# Patient Record
Sex: Female | Born: 1965 | Race: Black or African American | Hispanic: No | Smoking: Former smoker
Health system: Southern US, Community
[De-identification: ages and names within clinical notes are randomized; demographics above are authoritative.]

## PROBLEM LIST (undated history)

## (undated) DIAGNOSIS — J301 Allergic rhinitis due to pollen: Secondary | ICD-10-CM

## (undated) DIAGNOSIS — F419 Anxiety disorder, unspecified: Secondary | ICD-10-CM

## (undated) DIAGNOSIS — Z9889 Other specified postprocedural states: Secondary | ICD-10-CM

## (undated) DIAGNOSIS — D219 Benign neoplasm of connective and other soft tissue, unspecified: Secondary | ICD-10-CM

## (undated) DIAGNOSIS — I10 Essential (primary) hypertension: Secondary | ICD-10-CM

## (undated) DIAGNOSIS — T7840XA Allergy, unspecified, initial encounter: Secondary | ICD-10-CM

## (undated) DIAGNOSIS — R112 Nausea with vomiting, unspecified: Secondary | ICD-10-CM

## (undated) HISTORY — DX: Essential (primary) hypertension: I10

## (undated) HISTORY — PX: WISDOM TOOTH EXTRACTION: SHX21

## (undated) HISTORY — DX: Allergic rhinitis due to pollen: J30.1

## (undated) HISTORY — PX: TUBAL LIGATION: SHX77

## (undated) HISTORY — DX: Allergy, unspecified, initial encounter: T78.40XA

## (undated) HISTORY — DX: Anxiety disorder, unspecified: F41.9

## (undated) HISTORY — PX: OTHER SURGICAL HISTORY: SHX169

---

## 2008-05-17 ENCOUNTER — Encounter: Admission: RE | Admit: 2008-05-17 | Discharge: 2008-05-17 | Payer: Self-pay | Admitting: Obstetrics

## 2009-06-08 ENCOUNTER — Encounter: Admission: RE | Admit: 2009-06-08 | Discharge: 2009-06-08 | Payer: Self-pay | Admitting: Obstetrics

## 2010-06-12 ENCOUNTER — Encounter: Admission: RE | Admit: 2010-06-12 | Discharge: 2010-06-12 | Payer: Self-pay | Admitting: Obstetrics

## 2011-05-17 ENCOUNTER — Other Ambulatory Visit: Payer: Self-pay | Admitting: Obstetrics

## 2011-05-17 DIAGNOSIS — Z1231 Encounter for screening mammogram for malignant neoplasm of breast: Secondary | ICD-10-CM

## 2011-06-25 ENCOUNTER — Ambulatory Visit
Admission: RE | Admit: 2011-06-25 | Discharge: 2011-06-25 | Disposition: A | Discharge status: Home / Self Care | Payer: BC Managed Care – PPO | Source: Ambulatory Visit | Attending: Obstetrics | Admitting: Obstetrics

## 2011-06-25 DIAGNOSIS — Z1231 Encounter for screening mammogram for malignant neoplasm of breast: Secondary | ICD-10-CM

## 2012-06-09 ENCOUNTER — Other Ambulatory Visit: Payer: Self-pay | Admitting: Obstetrics

## 2012-06-09 DIAGNOSIS — Z1231 Encounter for screening mammogram for malignant neoplasm of breast: Secondary | ICD-10-CM

## 2012-07-02 ENCOUNTER — Ambulatory Visit
Admission: RE | Admit: 2012-07-02 | Discharge: 2012-07-02 | Disposition: A | Discharge status: Home / Self Care | Payer: BC Managed Care – PPO | Source: Ambulatory Visit | Attending: Obstetrics | Admitting: Obstetrics

## 2012-07-02 DIAGNOSIS — Z1231 Encounter for screening mammogram for malignant neoplasm of breast: Secondary | ICD-10-CM

## 2013-02-19 ENCOUNTER — Emergency Department (HOSPITAL_BASED_OUTPATIENT_CLINIC_OR_DEPARTMENT_OTHER)
Admission: EM | Admit: 2013-02-19 | Discharge: 2013-02-19 | Disposition: A | Discharge status: Home / Self Care | Payer: BC Managed Care – PPO | Attending: Emergency Medicine | Admitting: Emergency Medicine

## 2013-02-19 ENCOUNTER — Encounter (HOSPITAL_BASED_OUTPATIENT_CLINIC_OR_DEPARTMENT_OTHER): Payer: Self-pay

## 2013-02-19 DIAGNOSIS — Y9389 Activity, other specified: Secondary | ICD-10-CM | POA: Insufficient documentation

## 2013-02-19 DIAGNOSIS — Y92410 Unspecified street and highway as the place of occurrence of the external cause: Secondary | ICD-10-CM | POA: Insufficient documentation

## 2013-02-19 DIAGNOSIS — S239XXA Sprain of unspecified parts of thorax, initial encounter: Secondary | ICD-10-CM | POA: Insufficient documentation

## 2013-02-19 MED ORDER — IBUPROFEN 800 MG PO TABS: 800.0000 mg | ORAL_TABLET | Freq: Three times a day (TID) | ORAL | Status: DC

## 2013-02-19 MED ORDER — METHOCARBAMOL 500 MG PO TABS: 500.0000 mg | ORAL_TABLET | Freq: Two times a day (BID) | ORAL | Status: DC
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
## 2013-02-19 NOTE — ED Provider Notes (Signed)
Medical screening examination/treatment/procedure(s) were performed by non-physician practitioner and as supervising physician I was immediately available for consultation/collaboration.   Gilda Crease, MD 02/19/13 [phone]

## 2013-05-26 ENCOUNTER — Other Ambulatory Visit: Payer: Self-pay

## 2013-05-26 DIAGNOSIS — Z1231 Encounter for screening mammogram for malignant neoplasm of breast: Secondary | ICD-10-CM

## 2013-07-06 ENCOUNTER — Ambulatory Visit: Payer: BC Managed Care – PPO

## 2013-07-10 ENCOUNTER — Ambulatory Visit: Payer: BC Managed Care – PPO

## 2013-07-15 ENCOUNTER — Ambulatory Visit
Admission: RE | Admit: 2013-07-15 | Discharge: 2013-07-15 | Disposition: A | Discharge status: Home / Self Care | Payer: BC Managed Care – PPO | Source: Ambulatory Visit

## 2013-07-15 DIAGNOSIS — Z1231 Encounter for screening mammogram for malignant neoplasm of breast: Secondary | ICD-10-CM

## 2014-06-22 ENCOUNTER — Other Ambulatory Visit: Payer: Self-pay

## 2014-06-22 DIAGNOSIS — Z1231 Encounter for screening mammogram for malignant neoplasm of breast: Secondary | ICD-10-CM

## 2014-07-19 ENCOUNTER — Ambulatory Visit
Admission: RE | Admit: 2014-07-19 | Discharge: 2014-07-19 | Disposition: A | Discharge status: Home / Self Care | Payer: BC Managed Care – PPO | Source: Ambulatory Visit

## 2014-07-19 DIAGNOSIS — Z1231 Encounter for screening mammogram for malignant neoplasm of breast: Secondary | ICD-10-CM

## 2015-05-30 ENCOUNTER — Other Ambulatory Visit: Payer: Self-pay | Admitting: Obstetrics and Gynecology

## 2015-05-30 DIAGNOSIS — D259 Leiomyoma of uterus, unspecified: Secondary | ICD-10-CM

## 2015-06-07 ENCOUNTER — Other Ambulatory Visit: Payer: Self-pay | Admitting: Obstetrics and Gynecology

## 2015-06-07 ENCOUNTER — Ambulatory Visit
Admission: RE | Admit: 2015-06-07 | Discharge: 2015-06-07 | Disposition: A | Discharge status: Home / Self Care | Payer: 59 | Source: Ambulatory Visit | Attending: Obstetrics and Gynecology | Admitting: Obstetrics and Gynecology

## 2015-06-07 DIAGNOSIS — D259 Leiomyoma of uterus, unspecified: Secondary | ICD-10-CM | POA: Insufficient documentation

## 2015-06-07 HISTORY — DX: Benign neoplasm of connective and other soft tissue, unspecified: D21.9

## 2015-06-07 NOTE — Consult Note (Signed)
Chief Complaint: Chief Complaint  Patient presents with  . Fibroids    Consult for [place]    Referring Physician(s): Cousins,Sheronette  History of Present Illness: [name] is a 49 y.o. (G2, P2) female significant only for GERD and obesity who presents to the IR clinic for evaluation of symptomatic uterine fibroids.  The patient is accompanied by her husband though serves as her own historian.  The patient has known she's had fibroids for "many years."  Previously, the patient had symptoms of menorrhagia, however this improved following an endometrial ablation performed in 2012.  Since that time, the patients states that her symptoms of dysmenorrhea has worsened, currently resulting in her being nearly bed bound for 2 days of her cycle due to intense pelvic pain.  The patient admits to peri-menopausal symptoms with her cycle currently being slightly irregular, occuring every 6-8 weeks.  Her cycle lasts approximately 4 days, 2 days of which are associated with dysmenorrhea.  She denies recurrent menorrhagia though admits to occasional spotting.  She admits to abdominal bloating and distension which is worse at the time of her cycle.  She denies dysuria, hematuria or flank pain.  She denies constipation, diarrhea, bloody or melanotic stools.  The patient would like to avoid a surgical procedure.  Past Medical History  Diagnosis Date  . Fibroids   . Fibroids     Past Surgical History  Procedure Laterality Date  . Tubal ligation    . Nova sure      Allergies: Codeine  Medications: Prior to Admission medications   Medication Sig Start Date End Date Taking? Authorizing Provider  BIOTIN PO Take by mouth.   Yes Historical Provider, MD  Cyanocobalamin (VITAMIN B 12 PO) Take 1 tablet by mouth daily.   Yes Historical Provider, MD  ibuprofen (ADVIL,MOTRIN) 800 MG tablet Take 1 tablet (800 mg total) by mouth 3 (three) times daily. 02/19/13  Yes Hollace Kinnier Sofia, PA-C    Multiple Vitamins-Minerals (MULTIVITAMIN PO) Take by mouth.   Yes Historical Provider, MD  methocarbamol (ROBAXIN) 500 MG tablet Take 1 tablet (500 mg total) by mouth 2 (two) times daily. Patient not taking: Reported on 06/07/2015 02/19/13   Fransico Meadow, PA-C     No family history on file.  History   Social History  . Marital Status: Unknown    Spouse Name: N/A  . Number of Children: N/A  . Years of Education: N/A   Social History Main Topics  . Smoking status: Never Smoker   . Smokeless tobacco: Not on file  . Alcohol Use: No  . Drug Use: No  . Sexual Activity: Not on file   Other Topics Concern  . Not on file   Social History Narrative    ECOG Status: 0 - Asymptomatic  Review of Systems: A 12 point ROS discussed and pertinent positives are indicated in the HPI above.  All other systems are negative.  Review of Systems  Constitutional: Negative.   Respiratory: Negative.   Cardiovascular: Negative.   Gastrointestinal: Positive for abdominal pain.       Patient admits to abdominal bloating, worse at the time of her cycle.  Genitourinary: Positive for menstrual problem and pelvic pain. Negative for urgency, hematuria and difficulty urinating.  Musculoskeletal: Negative for back pain.  Psychiatric/Behavioral: Negative.     Vital Signs: BP 116/70 mmHg  Pulse 88  Temp(Src) 98.5 F (36.9 C) (Oral)  Resp 14  Ht 5\' 4"  (1.626 m)  Wt  182 lb (82.555 kg)  BMI 31.22 kg/m2  SpO2 99%  LMP 05/17/2015 (Approximate)  Physical Exam  Constitutional: She appears well-developed and well-nourished.  HENT:  Head: Normocephalic and atraumatic.  Cardiovascular: Normal rate, regular rhythm and intact distal pulses.   Easily palpable R CFA and DP pulses.  Pulmonary/Chest: Effort normal and breath sounds normal.  Abdominal: Soft. Bowel sounds are normal. She exhibits no mass. There is no tenderness.  I am unable to palpate the uterine fundus.  Skin: Skin is warm and dry.   Psychiatric: She has a normal mood and affect.  Nursing note and vitals reviewed.   Imaging:  Intra-office pelvic US performed 09/29/2014 reports multiple fibroids with 2 discretely measured, the largest of which measures 2.5 cm in diameter. The bilateral ovaries are reportedly normal.   Labs:  CBC: No results for input(s): WBC, HGB, HCT, PLT in the last 8760 hours.  COAGS: No results for input(s): INR, APTT in the last 8760 hours.  BMP: No results for input(s): NA, K, CL, CO2, GLUCOSE, BUN, CALCIUM, CREATININE, GFRNONAA, GFRAA in the last 8760 hours.  Invalid input(s): CMP  LIVER FUNCTION TESTS: No results for input(s): BILITOT, AST, ALT, ALKPHOS, PROT, ALBUMIN in the last 8760 hours.  TUMOR MARKERS: No results for input(s): AFPTM, CEA, CA199, CHROMGRNA in the last 8760 hours.   Pap Smear performed 05/18/2015 was negative  Assessment and Plan:  [name] is a 49 y.o. (G2, P2) female significant only for GERD and obesity who presents to the IR clinic for evaluation of symptomatic uterine fibroids.  The patient is accompanied by her husband though serves as her own historian.  The patient has known she's had fibroids for "many years."  Previously, the patient had symptoms of menorrhagia, however this improved following an endometrial ablation performed in 2012.  Since that time, the patients states that her symptoms of dysmenorrhea has worsened, currently resulting in her being nearly bed bound for 2 days of her cycle due to intense pelvic pain.  She denies recurrent menorrhagia though admits to occasional spotting.  She admits to abdominal bloating and distension which is worse at the time of her cycle.    Prolonged discussion were held with the patient regarding potential treatment options including continued conservative management and hysterectomy.  While the patient would like to pursue intervention, she would like to avoid a surgical procedure.  As such,  prolonged discussion were held with the patient regarding the benefits and risks (including but not limited to vessel injury, bleeding, non-target embolization, contrast and radiation exposure) for uterine fibroid embolization.  Following this discussion, the patient wishes to pursue UFE.  As such, a contrast enhanced pelvic MRI and an endometrial biopsy will be obtained.  Following the results of these exams, the patient will be scheduled for UFE at [facility].  The procedure will entail an overnight admission for continued observation and PCA usage.   Thank you for this interesting consult.  I greatly enjoyed meeting Majesta Leichter and look forward to participating in her care.  SignedSandi Mariscal 06/07/2015, 11:48 AM   I spent a total of 30 Minutes in face to face in clinical consultation, greater than 50% of which was counseling/coordinating care for symptomatic uterine fibroids.

## 2015-06-24 ENCOUNTER — Other Ambulatory Visit: Payer: Self-pay

## 2015-06-24 DIAGNOSIS — Z1231 Encounter for screening mammogram for malignant neoplasm of breast: Secondary | ICD-10-CM

## 2015-06-25 ENCOUNTER — Ambulatory Visit
Admission: RE | Admit: 2015-06-25 | Discharge: 2015-06-25 | Disposition: A | Discharge status: Home / Self Care | Payer: 59 | Source: Ambulatory Visit | Attending: Obstetrics and Gynecology | Admitting: Obstetrics and Gynecology

## 2015-06-25 DIAGNOSIS — D259 Leiomyoma of uterus, unspecified: Secondary | ICD-10-CM

## 2015-06-25 MED ORDER — GADOBENATE DIMEGLUMINE 529 MG/ML IV SOLN
16.0000 mL | Freq: Once | INTRAVENOUS | Status: AC | PRN
  Administered 2015-06-25: 16 mL via INTRAVENOUS

## 2015-06-27 ENCOUNTER — Telehealth: Payer: Self-pay | Admitting: Interventional Radiology

## 2015-06-27 ENCOUNTER — Other Ambulatory Visit: Payer: Self-pay | Admitting: Obstetrics and Gynecology

## 2015-06-27 ENCOUNTER — Other Ambulatory Visit (HOSPITAL_COMMUNITY): Payer: Self-pay | Admitting: Interventional Radiology

## 2015-06-27 DIAGNOSIS — D259 Leiomyoma of uterus, unspecified: Secondary | ICD-10-CM

## 2015-06-27 NOTE — Progress Notes (Signed)
I discussed the results of the recently acquired pelvic MRI (obtained 06/25/2015) with the patient and explained to the that as opposed to having a fibroid, the MRI demonstrates an approximately 3 cm ill-defined area within the anterior aspect of the uterine fundus which was favored to represent a focal adenomyoma.    As uterine fibroid embolization has been known to be less effective for adenomyomatosis as opposed to fibroids, I explained the patient may want to consider hysterectomy over uterine artery embolization.    With that being said, uterine artery embolization could be performed and if the patient's symptoms do not improve prior to the onset of menopause (the patient is currently 49 years old), a subsequent hysterectomy could be performed as indicated.    The patient demonstrated excellent understanding of the above discussion and will discuss this with her husband.    The Interventional Radiology Clinic will call the patient in 1 week to discern her ultimate desired plan of care which could include the following: - Proceeding with UFE - The pt (and the pt's husband) returning to IR clinic to review the images and discuss UFE treatment outcomesin the setting of adenomyomatosis. - Proceeding with hysterectomy.

## 2015-07-13 ENCOUNTER — Ambulatory Visit
Admission: RE | Admit: 2015-07-13 | Discharge: 2015-07-13 | Disposition: A | Discharge status: Home / Self Care | Payer: 59 | Source: Ambulatory Visit | Attending: Interventional Radiology | Admitting: Interventional Radiology

## 2015-07-13 DIAGNOSIS — D259 Leiomyoma of uterus, unspecified: Secondary | ICD-10-CM

## 2015-07-13 NOTE — Progress Notes (Signed)
Patient ID: [name], female   DOB: 1966/09/08, 49 y.o.   MRN: [id number]    Chief Complaint: Discussion of pelvic MRI performed 06/25/2015 for evaluation of potential uterine fibroid embolization  Referring Physician(s): Cousins  History of Present Illness: [name] is a 49 y.o. (G1, P2) female with past medical history significant only for GERD and obesity who returns to the interventional radiology clinic for discussion of results following preprocedural contrast enhanced pelvic MRI obtained 07/05/2015. The patient is accompanied by her husband and grandson though serves as her own historian.  The patient was initially seen at the interventional radiology clinic for consultation of potential percutaneous treatment options for the patient's presumed symptomatic uterine fibroids on 06/07/2015. In brief, at that time, the patient's main complaint revolves around her worsening dysmenorrhagia which currently results in her being nearly bedbound during 2 days of her cycle due to intense pelvic pain. Note, the patient had a history of previously menorrhagia though states this has markedly improved following an endometrial ablation performed in 2012. Also of note, the patient had an attempted endometrial biopsy, however this ultimately was unsuccessful due to scarring at the origin of her endometrial canal.  The patient is otherwise without complaint.   Past Medical History  Diagnosis Date  . Fibroids   . Fibroids     Past Surgical History  Procedure Laterality Date  . Tubal ligation    . Nova sure      Allergies: Codeine  Medications: Prior to Admission medications   Medication Sig Start Date End Date Taking? Authorizing Provider  BIOTIN PO Take by mouth.    Historical Provider, MD  Cyanocobalamin (VITAMIN B 12 PO) Take 1 tablet by mouth daily.    Historical Provider, MD  ibuprofen (ADVIL,MOTRIN) 800 MG tablet Take 1 tablet (800 mg total) by mouth 3 (three)  times daily. 02/19/13   Fransico Meadow, PA-C  methocarbamol (ROBAXIN) 500 MG tablet Take 1 tablet (500 mg total) by mouth 2 (two) times daily. Patient not taking: Reported on 06/07/2015 02/19/13   Fransico Meadow, PA-C  Multiple Vitamins-Minerals (MULTIVITAMIN PO) Take by mouth.    Historical Provider, MD     No family history on file.  History   Social History  . Marital Status: Unknown    Spouse Name: N/A  . Number of Children: N/A  . Years of Education: N/A   Social History Main Topics  . Smoking status: Never Smoker   . Smokeless tobacco: Not on file  . Alcohol Use: No  . Drug Use: No  . Sexual Activity: Not on file   Other Topics Concern  . None   Social History Narrative    ECOG Status: 0 - Asymptomatic  Review of Systems: A 12 point ROS discussed and pertinent positives are indicated in the HPI above.  All other systems are negative.  Review of Systems  Constitutional: Negative for fever, activity change, appetite change and fatigue.  Gastrointestinal: Negative.   Genitourinary: Positive for menstrual problem and pelvic pain. Negative for dysuria, urgency, flank pain, vaginal bleeding and vaginal discharge.  Psychiatric/Behavioral: Negative.      Vital Signs: BP 127/78 mmHg  Pulse 77  Temp(Src) 98.5 F (36.9 C) (Oral)  Resp 15  SpO2 98%  LMP 07/13/2015  Physical Exam  Nursing note and vitals reviewed.   Imaging: Mr Pelvis W Wo Contrast  06/25/2015   CLINICAL DATA:  Fibroids. Menorrhagia. Previous endometrial ablation. Preop evaluation for uterine artery embolization.  EXAM:  MRI PELVIS WITHOUT AND WITH CONTRAST  TECHNIQUE: Multiplanar multisequence MR imaging of the pelvis was performed both before and after administration of intravenous contrast.  CONTRAST:  11mL MULTIHANCE GADOBENATE DIMEGLUMINE 529 MG/ML IV SOLN  COMPARISON:  None.  FINDINGS: Uterus:  Measures 8.6 x 5.6 x 6.2 cm.   Estimated volume = 150 cc  Fibroids: Heterogeneous appearance of uterine  myometrium and obliteration of endometrial cavity is seen, consistent with previous history of endometrial ablation. A 3 cm ill-defined T2 hypo intense lesion is seen in the fundal myometrium which shows several tiny internal cystic foci. This favors adenomyoma over fibroid. No other distinct myometrial masses visualized.  Intracavitary fibroids:  None.  Pedunculated fibroids: None.  Endometrium: Changes of previous endometrial ablation noted. No evidence of hydrometros.  Cervix:  Normal appearance.  Right ovary:  Normal appearance.  No adnexal mass identified.  Left ovary:  Normal appearance.  No adnexal mass identified.  Free fluid:  None.  Other: No other pelvic masses, lymphadenopathy, or inflammatory process identified.  IMPRESSION: Postprocedure changes from previous endometrial ablation.  3 cm ill-defined lesion with tiny cystic foci in the fundal myometrium, which favors a focal adenomyoma over fibroid. No other definite fibroids identified.  Normal appearance of both ovaries.  No adnexal mass identified.   Electronically Signed   By: Earle Gell M.D.   On: 06/25/2015 15:39    Selected images from the contrast enhanced pelvic MRI were reviewed in detail with the patient and the patient's husband.  Labs:  CBC: No results for input(s): WBC, HGB, HCT, PLT in the last 8760 hours.  COAGS: No results for input(s): INR, APTT in the last 8760 hours.  BMP: No results for input(s): NA, K, CL, CO2, GLUCOSE, BUN, CALCIUM, CREATININE, GFRNONAA, GFRAA in the last 8760 hours.  Invalid input(s): CMP  LIVER FUNCTION TESTS: No results for input(s): BILITOT, AST, ALT, ALKPHOS, PROT, ALBUMIN in the last 8760 hours.  Assessment and Plan:  [name] is a 49 y.o. (G1, P2) female with past medical history significant only for GERD and obesity who returns to the interventional radiology clinic for discussion of the results following preprocedural contrast enhanced pelvic MRI obtained 07/05/2015.     The contrast enhanced pelvic MRI demonstrates an approximately 3 cm ill-defined area within the anterior aspect of the uterine fundus which as opposed to being representative of a fibroid is felt to represent a focal adenomyoma. In fact, no discrete uterine fibroids were demonstrated on the pelvic enhanced MRI.  Selected images from the contrast enhanced pelvic MRI were reviewed in detail with the patient and the patient's husband.  Prolonged discussions were held with the patient regarding the efficacy of uterine artery embolization in the setting of an adenomyoma. I explained to the patient that success rate in the literature has been quoted at approximately 50% and as such, I could not claim the same efficacy as if her symptoms were solely attributable to fibroids.  At the present time, the patient and the patient's husband are weighing the options of either proceeding with hysterectomy versus uterine artery embolization but currently they are both undecided.  The patient will contact the interventional radiology clinic pending her ultimate decision.  A copy of this report was sent to the requesting provider on this date.  SignedSandi Mariscal 07/13/2015, 3:58 PM   I spent a total of 25 Minutes in face to face in clinical consultation, greater than 50% of which was counseling/coordinating care for symptomatic adenomyoma.

## 2015-07-27 ENCOUNTER — Ambulatory Visit
Admission: RE | Admit: 2015-07-27 | Discharge: 2015-07-27 | Disposition: A | Discharge status: Home / Self Care | Payer: 59 | Source: Ambulatory Visit

## 2015-07-27 DIAGNOSIS — Z1231 Encounter for screening mammogram for malignant neoplasm of breast: Secondary | ICD-10-CM

## 2015-12-21 ENCOUNTER — Other Ambulatory Visit: Payer: Self-pay | Admitting: Obstetrics and Gynecology

## 2015-12-27 NOTE — Patient Instructions (Signed)
Your procedure is scheduled on:  Thursday, Jan. 26, 2017  Enter through the Main Entrance of [facility] at:  6:00 A.M.  Pick up the phone at the desk and dial [phone].  Call this number if you have problems the morning of surgery: [phone].  Remember: Do NOT eat food or drink after:  Midnight Wednesday, Jan. 25, 2017 Take these medicines the morning of surgery with a SIP OF WATER:  None  Do NOT wear jewelry (body piercing), metal hair clips/bobby pins, make-up, or nail polish. Do NOT wear lotions, powders, or perfumes.  You may wear deoderant. Do NOT shave for 48 hours prior to surgery. Do NOT bring valuables to the hospital. Contacts, dentures, or bridgework may not be worn into surgery. Leave suitcase in car.  After surgery it may be brought to your room.  For patients admitted to the hospital, checkout time is 11:00 AM the day of discharge.

## 2015-12-28 ENCOUNTER — Encounter (HOSPITAL_COMMUNITY)
Admission: RE | Admit: 2015-12-28 | Discharge: 2015-12-28 | Disposition: A | Discharge status: Home / Self Care | Payer: 59 | Source: Ambulatory Visit | Attending: Obstetrics and Gynecology | Admitting: Obstetrics and Gynecology

## 2015-12-28 ENCOUNTER — Encounter (HOSPITAL_COMMUNITY): Payer: Self-pay

## 2015-12-28 DIAGNOSIS — N946 Dysmenorrhea, unspecified: Secondary | ICD-10-CM | POA: Insufficient documentation

## 2015-12-28 DIAGNOSIS — D259 Leiomyoma of uterus, unspecified: Secondary | ICD-10-CM | POA: Insufficient documentation

## 2015-12-28 DIAGNOSIS — Z01812 Encounter for preprocedural laboratory examination: Secondary | ICD-10-CM | POA: Diagnosis present

## 2015-12-28 HISTORY — DX: Nausea with vomiting, unspecified: R11.2

## 2015-12-28 HISTORY — DX: Other specified postprocedural states: Z98.890

## 2015-12-28 LAB — BASIC METABOLIC PANEL
Anion gap: 6 (ref 5–15)
BUN: 17 mg/dL (ref 6–20)
CHLORIDE: 105 mmol/L (ref 101–111)
CO2: 28 mmol/L (ref 22–32)
CREATININE: 0.81 mg/dL (ref 0.44–1.00)
Calcium: 9.5 mg/dL (ref 8.9–10.3)
GFR calc Af Amer: >60 mL/min (ref >60)
GFR calc non Af Amer: >60 mL/min (ref >60)
GLUCOSE: 87 mg/dL (ref 65–99)
POTASSIUM: 4.3 mmol/L (ref 3.5–5.1)
SODIUM: 139 mmol/L (ref 135–145)

## 2015-12-28 LAB — CBC
HEMATOCRIT: 37.5 % (ref 36.0–46.0)
Hemoglobin: 12.5 g/dL (ref 12.0–15.0)
MCH: 27.9 pg (ref 26.0–34.0)
MCHC: 33.3 g/dL (ref 30.0–36.0)
MCV: 83.7 fL (ref 78.0–100.0)
PLATELETS: 280 10*3/uL (ref 150–400)
RBC: 4.48 MIL/uL (ref 3.87–5.11)
RDW: 13.9 % (ref 11.5–15.5)
WBC: 4.8 10*3/uL (ref 4.0–10.5)

## 2016-01-12 ENCOUNTER — Ambulatory Visit (HOSPITAL_COMMUNITY)
Admission: RE | Admit: 2016-01-12 | Discharge: 2016-01-13 | Disposition: A | Discharge status: Home / Self Care | Payer: 59 | Source: Ambulatory Visit | Attending: Obstetrics and Gynecology | Admitting: Obstetrics and Gynecology

## 2016-01-12 ENCOUNTER — Encounter (HOSPITAL_COMMUNITY): Payer: Self-pay | Admitting: *Deleted

## 2016-01-12 ENCOUNTER — Encounter (HOSPITAL_COMMUNITY)
Admission: RE | Disposition: A | Discharge status: Home / Self Care | Payer: Self-pay | Source: Ambulatory Visit | Attending: Obstetrics and Gynecology

## 2016-01-12 ENCOUNTER — Ambulatory Visit (HOSPITAL_COMMUNITY): Payer: 59 | Admitting: Anesthesiology

## 2016-01-12 DIAGNOSIS — N946 Dysmenorrhea, unspecified: Secondary | ICD-10-CM | POA: Insufficient documentation

## 2016-01-12 DIAGNOSIS — Z90710 Acquired absence of both cervix and uterus: Secondary | ICD-10-CM | POA: Diagnosis present

## 2016-01-12 DIAGNOSIS — N800 Endometriosis of uterus: Principal | ICD-10-CM | POA: Insufficient documentation

## 2016-01-12 HISTORY — PX: ROBOTIC ASSISTED TOTAL HYSTERECTOMY WITH SALPINGECTOMY: SHX6679

## 2016-01-12 LAB — CBC
HCT: 36.8 % (ref 36.0–46.0)
Hemoglobin: 12.3 g/dL (ref 12.0–15.0)
MCH: 28.2 pg (ref 26.0–34.0)
MCHC: 33.4 g/dL (ref 30.0–36.0)
MCV: 84.4 fL (ref 78.0–100.0)
PLATELETS: 218 10*3/uL (ref 150–400)
RBC: 4.36 MIL/uL (ref 3.87–5.11)
RDW: 13.9 % (ref 11.5–15.5)
WBC: 7.6 10*3/uL (ref 4.0–10.5)

## 2016-01-12 LAB — BASIC METABOLIC PANEL
Anion gap: 8 (ref 5–15)
BUN: 10 mg/dL (ref 6–20)
CHLORIDE: 107 mmol/L (ref 101–111)
CO2: 24 mmol/L (ref 22–32)
CREATININE: 0.91 mg/dL (ref 0.44–1.00)
Calcium: 8.9 mg/dL (ref 8.9–10.3)
GFR calc Af Amer: >60 mL/min (ref >60)
GLUCOSE: 152 mg/dL — AB (ref 65–99)
Potassium: 4.6 mmol/L (ref 3.5–5.1)
SODIUM: 139 mmol/L (ref 135–145)

## 2016-01-12 SURGERY — ROBOTIC ASSISTED TOTAL HYSTERECTOMY WITH SALPINGECTOMY
Anesthesia: General | Laterality: Bilateral

## 2016-01-12 MED ORDER — SCOPOLAMINE 1 MG/3DAYS TD PT72
MEDICATED_PATCH | TRANSDERMAL | Status: AC
  Administered 2016-01-12: 1.5 mg via TRANSDERMAL
  Filled 2016-01-12: qty 1

## 2016-01-12 MED ORDER — ONDANSETRON HCL 4 MG/2ML IJ SOLN
INTRAMUSCULAR | Status: AC
  Filled 2016-01-12: qty 2

## 2016-01-12 MED ORDER — ONDANSETRON HCL 4 MG/2ML IJ SOLN: 4.0000 mg | Freq: Four times a day (QID) | INTRAMUSCULAR | Status: DC | PRN

## 2016-01-12 MED ORDER — MIDAZOLAM HCL 2 MG/2ML IJ SOLN
INTRAMUSCULAR | Status: AC
  Filled 2016-01-12: qty 2

## 2016-01-12 MED ORDER — MENTHOL 3 MG MT LOZG: 1.0000 | LOZENGE | OROMUCOSAL | Status: DC | PRN

## 2016-01-12 MED ORDER — KETOROLAC TROMETHAMINE 30 MG/ML IJ SOLN
INTRAMUSCULAR | Status: DC | PRN
  Administered 2016-01-12: 30 mg via INTRAVENOUS

## 2016-01-12 MED ORDER — ROCURONIUM BROMIDE 100 MG/10ML IV SOLN
INTRAVENOUS | Status: AC
  Filled 2016-01-12: qty 1

## 2016-01-12 MED ORDER — CEFAZOLIN SODIUM-DEXTROSE 2-3 GM-% IV SOLR
2.0000 g | INTRAVENOUS | Status: AC
  Administered 2016-01-12: 2 g via INTRAVENOUS

## 2016-01-12 MED ORDER — ACETAMINOPHEN 160 MG/5ML PO SOLN: 325.0000 mg | ORAL | Status: DC | PRN

## 2016-01-12 MED ORDER — CEFAZOLIN SODIUM-DEXTROSE 2-3 GM-% IV SOLR
INTRAVENOUS | Status: AC
  Filled 2016-01-12: qty 50

## 2016-01-12 MED ORDER — SODIUM CHLORIDE 0.9% FLUSH
INTRAVENOUS | Status: AC
  Filled 2016-01-12: qty 3

## 2016-01-12 MED ORDER — MIDAZOLAM HCL 2 MG/2ML IJ SOLN
INTRAMUSCULAR | Status: DC | PRN
  Administered 2016-01-12: 2 mg via INTRAVENOUS

## 2016-01-12 MED ORDER — PROPOFOL 10 MG/ML IV BOLUS
INTRAVENOUS | Status: DC | PRN
  Administered 2016-01-12: 180 mg via INTRAVENOUS

## 2016-01-12 MED ORDER — DEXAMETHASONE SODIUM PHOSPHATE 10 MG/ML IJ SOLN
INTRAMUSCULAR | Status: DC | PRN
  Administered 2016-01-12: 4 mg via INTRAVENOUS

## 2016-01-12 MED ORDER — BUPIVACAINE HCL (PF) 0.25 % IJ SOLN
INTRAMUSCULAR | Status: AC
  Filled 2016-01-12: qty 30

## 2016-01-12 MED ORDER — HYDROMORPHONE HCL 1 MG/ML IJ SOLN
INTRAMUSCULAR | Status: AC
  Filled 2016-01-12: qty 1

## 2016-01-12 MED ORDER — LIDOCAINE HCL (CARDIAC) 20 MG/ML IV SOLN
INTRAVENOUS | Status: AC
  Filled 2016-01-12: qty 5

## 2016-01-12 MED ORDER — OXYCODONE HCL 5 MG PO TABS: 5.0000 mg | ORAL_TABLET | Freq: Once | ORAL | Status: DC | PRN

## 2016-01-12 MED ORDER — SODIUM CHLORIDE 0.9 % IJ SOLN
INTRAMUSCULAR | Status: AC
  Filled 2016-01-12: qty 50

## 2016-01-12 MED ORDER — ONDANSETRON HCL 4 MG/2ML IJ SOLN
INTRAMUSCULAR | Status: DC | PRN
Start: 2016-01-12 — End: 2016-01-12
  Administered 2016-01-12: 4 mg via INTRAVENOUS

## 2016-01-12 MED ORDER — SCOPOLAMINE 1 MG/3DAYS TD PT72
1.0000 | MEDICATED_PATCH | Freq: Once | TRANSDERMAL | Status: DC
  Administered 2016-01-12: 1.5 mg via TRANSDERMAL

## 2016-01-12 MED ORDER — ARTIFICIAL TEARS OP OINT
TOPICAL_OINTMENT | OPHTHALMIC | Status: AC
  Filled 2016-01-12: qty 3.5

## 2016-01-12 MED ORDER — NEOSTIGMINE METHYLSULFATE 10 MG/10ML IV SOLN
INTRAVENOUS | Status: DC | PRN
  Administered 2016-01-12: 3 mg via INTRAVENOUS

## 2016-01-12 MED ORDER — ROPIVACAINE HCL 5 MG/ML IJ SOLN
INTRAMUSCULAR | Status: DC | PRN
  Administered 2016-01-12: 60 mL

## 2016-01-12 MED ORDER — ROCURONIUM BROMIDE 100 MG/10ML IV SOLN
INTRAVENOUS | Status: DC | PRN
  Administered 2016-01-12: 5 mg via INTRAVENOUS
  Administered 2016-01-12: 40 mg via INTRAVENOUS
  Administered 2016-01-12 (×2): 10 mg via INTRAVENOUS

## 2016-01-12 MED ORDER — FENTANYL CITRATE (PF) 250 MCG/5ML IJ SOLN
INTRAMUSCULAR | Status: AC
  Filled 2016-01-12: qty 5

## 2016-01-12 MED ORDER — SIMETHICONE 80 MG PO CHEW: 80.0000 mg | CHEWABLE_TABLET | Freq: Four times a day (QID) | ORAL | Status: DC | PRN

## 2016-01-12 MED ORDER — KETOROLAC TROMETHAMINE 30 MG/ML IJ SOLN
30.0000 mg | Freq: Four times a day (QID) | INTRAMUSCULAR | Status: DC
  Administered 2016-01-12: 30 mg via INTRAVENOUS
  Filled 2016-01-12: qty 1

## 2016-01-12 MED ORDER — KETOROLAC TROMETHAMINE 30 MG/ML IJ SOLN
INTRAMUSCULAR | Status: AC
  Filled 2016-01-12: qty 1

## 2016-01-12 MED ORDER — LACTATED RINGERS IR SOLN
Status: DC | PRN
  Administered 2016-01-12: 3000 mL

## 2016-01-12 MED ORDER — OXYCODONE HCL 5 MG/5ML PO SOLN: 5.0000 mg | Freq: Once | ORAL | Status: DC | PRN

## 2016-01-12 MED ORDER — FENTANYL CITRATE (PF) 100 MCG/2ML IJ SOLN
INTRAMUSCULAR | Status: DC | PRN
  Administered 2016-01-12: 100 ug via INTRAVENOUS
  Administered 2016-01-12: 50 ug via INTRAVENOUS
  Administered 2016-01-12: 100 ug via INTRAVENOUS

## 2016-01-12 MED ORDER — KETOROLAC TROMETHAMINE 30 MG/ML IJ SOLN: 30.0000 mg | Freq: Four times a day (QID) | INTRAMUSCULAR | Status: DC

## 2016-01-12 MED ORDER — NEOSTIGMINE METHYLSULFATE 10 MG/10ML IV SOLN
INTRAVENOUS | Status: AC
  Filled 2016-01-12: qty 1

## 2016-01-12 MED ORDER — ONDANSETRON HCL 4 MG PO TABS: 4.0000 mg | ORAL_TABLET | Freq: Four times a day (QID) | ORAL | Status: DC | PRN

## 2016-01-12 MED ORDER — LACTATED RINGERS IV SOLN
INTRAVENOUS | Status: DC
  Administered 2016-01-12 (×2): via INTRAVENOUS

## 2016-01-12 MED ORDER — PANTOPRAZOLE SODIUM 40 MG PO TBEC: 40.0000 mg | DELAYED_RELEASE_TABLET | Freq: Every day | ORAL | Status: DC

## 2016-01-12 MED ORDER — GLYCOPYRROLATE 0.2 MG/ML IJ SOLN
INTRAMUSCULAR | Status: DC | PRN
  Administered 2016-01-12: 0.6 mg via INTRAVENOUS

## 2016-01-12 MED ORDER — VASOPRESSIN 20 UNIT/ML IV SOLN
INTRAVENOUS | Status: AC
  Filled 2016-01-12: qty 1

## 2016-01-12 MED ORDER — HYDROMORPHONE HCL 1 MG/ML IJ SOLN
0.2500 mg | INTRAMUSCULAR | Status: DC | PRN
  Administered 2016-01-12 (×3): 0.5 mg via INTRAVENOUS

## 2016-01-12 MED ORDER — IBUPROFEN 800 MG PO TABS
800.0000 mg | ORAL_TABLET | Freq: Three times a day (TID) | ORAL | Status: DC | PRN
  Administered 2016-01-13: 800 mg via ORAL
  Filled 2016-01-12: qty 1

## 2016-01-12 MED ORDER — DEXAMETHASONE SODIUM PHOSPHATE 4 MG/ML IJ SOLN
INTRAMUSCULAR | Status: AC
  Filled 2016-01-12: qty 1

## 2016-01-12 MED ORDER — DEXTROSE IN LACTATED RINGERS 5 % IV SOLN
INTRAVENOUS | Status: DC
  Administered 2016-01-12: 1000 mL via INTRAVENOUS

## 2016-01-12 MED ORDER — GLYCOPYRROLATE 0.2 MG/ML IJ SOLN
INTRAMUSCULAR | Status: AC
  Filled 2016-01-12: qty 3

## 2016-01-12 MED ORDER — BUPIVACAINE HCL (PF) 0.25 % IJ SOLN
INTRAMUSCULAR | Status: DC | PRN
  Administered 2016-01-12: 8 mL

## 2016-01-12 MED ORDER — OXYCODONE-ACETAMINOPHEN 5-325 MG PO TABS
1.0000 | ORAL_TABLET | ORAL | Status: DC | PRN
  Administered 2016-01-12 – 2016-01-13 (×3): 1 via ORAL
  Filled 2016-01-12 (×3): qty 1

## 2016-01-12 MED ORDER — ROPIVACAINE HCL 5 MG/ML IJ SOLN
INTRAMUSCULAR | Status: AC
  Filled 2016-01-12: qty 30

## 2016-01-12 MED ORDER — PROPOFOL 10 MG/ML IV BOLUS
INTRAVENOUS | Status: AC
  Filled 2016-01-12: qty 20

## 2016-01-12 MED ORDER — ACETAMINOPHEN 325 MG PO TABS: 325.0000 mg | ORAL_TABLET | ORAL | Status: DC | PRN

## 2016-01-12 SURGICAL SUPPLY — 52 items
BARRIER ADHS 3X4 INTERCEED (GAUZE/BANDAGES/DRESSINGS) IMPLANT
CATH FOLEY 3WAY  5CC 16FR (CATHETERS) ×1
CATH FOLEY 3WAY 5CC 16FR (CATHETERS) ×1 IMPLANT
CLOTH BEACON ORANGE TIMEOUT ST (SAFETY) ×2 IMPLANT
CONT PATH 16OZ SNAP LID 3702 (MISCELLANEOUS) ×2 IMPLANT
COVER BACK TABLE 60X90IN (DRAPES) ×4 IMPLANT
COVER TIP SHEARS 8 DVNC (MISCELLANEOUS) ×1 IMPLANT
COVER TIP SHEARS 8MM DA VINCI (MISCELLANEOUS) ×1
DECANTER SPIKE VIAL GLASS SM (MISCELLANEOUS) ×8 IMPLANT
DRSG OPSITE POSTOP 3X4 (GAUZE/BANDAGES/DRESSINGS) ×2 IMPLANT
DURAPREP 26ML APPLICATOR (WOUND CARE) ×2 IMPLANT
ELECT REM PT RETURN 9FT ADLT (ELECTROSURGICAL) ×2
ELECTRODE REM PT RTRN 9FT ADLT (ELECTROSURGICAL) ×1 IMPLANT
GAUZE VASELINE 3X9 (GAUZE/BANDAGES/DRESSINGS) IMPLANT
GLOVE BIO SURGEON STRL SZ7 (GLOVE) ×6 IMPLANT
GLOVE BIOGEL PI IND STRL 7.0 (GLOVE) ×5 IMPLANT
GLOVE BIOGEL PI INDICATOR 7.0 (GLOVE) ×5
GLOVE ECLIPSE 6.5 STRL STRAW (GLOVE) ×4 IMPLANT
KIT ACCESSORY DA VINCI DISP (KITS) ×1
KIT ACCESSORY DVNC DISP (KITS) ×1 IMPLANT
LEGGING LITHOTOMY PAIR STRL (DRAPES) ×2 IMPLANT
LIQUID BAND (GAUZE/BANDAGES/DRESSINGS) ×2 IMPLANT
NEEDLE INSUFFLATION 150MM (ENDOMECHANICALS) ×2 IMPLANT
OCCLUDER COLPOPNEUMO (BALLOONS) ×2 IMPLANT
PACK ROBOT WH (CUSTOM PROCEDURE TRAY) ×2 IMPLANT
PACK ROBOTIC GOWN (GOWN DISPOSABLE) ×2 IMPLANT
PAD PREP 24X48 CUFFED NSTRL (MISCELLANEOUS) ×4 IMPLANT
PAD TRENDELENBURG POSITION (MISCELLANEOUS) ×2 IMPLANT
PEN SKIN MARKING STD PT W/RULR (MISCELLANEOUS) ×2 IMPLANT
SCISSORS LAP 5X35 DISP (ENDOMECHANICALS) IMPLANT
SET CYSTO W/LG BORE CLAMP LF (SET/KITS/TRAYS/PACK) IMPLANT
SET IRRIG TUBING LAPAROSCOPIC (IRRIGATION / IRRIGATOR) ×2 IMPLANT
SET TRI-LUMEN FLTR TB AIRSEAL (TUBING) ×2 IMPLANT
SLEEVE XCEL OPT CAN 5 100 (ENDOMECHANICALS) ×2 IMPLANT
SUT VIC AB 0 CT1 36 (SUTURE) ×4 IMPLANT
SUT VICRYL 0 UR6 27IN ABS (SUTURE) ×2 IMPLANT
SUT VICRYL 4-0 PS2 18IN ABS (SUTURE) ×6 IMPLANT
SUT VLOC 180 0 9IN  GS21 (SUTURE) ×2
SUT VLOC 180 0 9IN GS21 (SUTURE) ×2 IMPLANT
SYR 50ML LL SCALE MARK (SYRINGE) ×2 IMPLANT
SYRINGE 10CC LL (SYRINGE) ×2 IMPLANT
TIP RUMI ORANGE 6.7MMX12CM (TIP) IMPLANT
TIP UTERINE 5.1X6CM LAV DISP (MISCELLANEOUS) IMPLANT
TIP UTERINE 6.7X10CM GRN DISP (MISCELLANEOUS) IMPLANT
TIP UTERINE 6.7X6CM WHT DISP (MISCELLANEOUS) IMPLANT
TIP UTERINE 6.7X8CM BLUE DISP (MISCELLANEOUS) ×2 IMPLANT
TOWEL OR 17X24 6PK STRL BLUE (TOWEL DISPOSABLE) ×6 IMPLANT
TROCAR DISP BLADELESS 8 DVNC (TROCAR) ×1 IMPLANT
TROCAR DISP BLADELESS 8MM (TROCAR) ×1
TROCAR PORT AIRSEAL 8X120 (TROCAR) IMPLANT
TROCAR Z-THREAD 12X150 (TROCAR) ×2 IMPLANT
WATER STERILE IRR 1000ML POUR (IV SOLUTION) ×6 IMPLANT

## 2016-01-12 NOTE — Transfer of Care (Signed)
Immediate Anesthesia Transfer of Care Note  Patient: [name]  Procedure(s) Performed: Procedure(s) with comments: ROBOTIC ASSISTED TOTAL HYSTERECTOMY WITH BILATERAL SALPINGECTOMY (Bilateral) - 3 hrs.  Patient Location: PACU  Anesthesia Type:General  Level of Consciousness: awake  Airway & Oxygen Therapy: Patient Spontanous Breathing  Post-op Assessment: Report given to PACU RN  Post vital signs: stable  Filed Vitals:   01/12/16 0607  BP: 130/72  Pulse: 74  Temp: 36.8 C  Resp: 20    Complications: No apparent anesthesia complications

## 2016-01-12 NOTE — Anesthesia Preprocedure Evaluation (Signed)
Anesthesia Evaluation  Patient identified by MRN, date of birth, ID band Patient awake    Reviewed: Allergy & Precautions, NPO status , Patient's Chart, lab work & pertinent test results  History of Anesthesia Complications (+) PONV and history of anesthetic complications  Airway Mallampati: II  TM Distance: >3 FB Neck ROM: Full    Dental  (+) Teeth Intact   Pulmonary neg pulmonary ROS,    breath sounds clear to auscultation       Cardiovascular negative cardio ROS   Rhythm:Regular     Neuro/Psych negative neurological ROS  negative psych ROS   GI/Hepatic negative GI ROS, Neg liver ROS,   Endo/Other  negative endocrine ROS  Renal/GU negative Renal ROS     Musculoskeletal negative musculoskeletal ROS (+)   Abdominal   Peds  Hematology negative hematology ROS (+)   Anesthesia Other Findings   Reproductive/Obstetrics                             Anesthesia Physical Anesthesia Plan  ASA: I  Anesthesia Plan: General   Post-op Pain Management:    Induction: Intravenous  Airway Management Planned: Oral ETT  Additional Equipment: None  Intra-op Plan:   Post-operative Plan: Extubation in OR  Informed Consent: I have reviewed the patients History and Physical, chart, labs and discussed the procedure including the risks, benefits and alternatives for the proposed anesthesia with the patient or authorized representative who has indicated his/her understanding and acceptance.   Dental advisory given  Plan Discussed with: CRNA and Surgeon  Anesthesia Plan Comments:         Anesthesia Quick Evaluation

## 2016-01-12 NOTE — Anesthesia Procedure Notes (Signed)
Procedure Name: Intubation Date/Time: 01/12/2016 7:53 AM Performed by: Casimer Lanius A Pre-anesthesia Checklist: Patient identified, Emergency Drugs available, Suction available and Patient being monitored Patient Re-evaluated:Patient Re-evaluated prior to inductionOxygen Delivery Method: Circle system utilized and Simple face mask Preoxygenation: Pre-oxygenation with 100% oxygen Intubation Type: IV induction Ventilation: Mask ventilation without difficulty Laryngoscope Size: Mac and 3 Grade View: Grade II Tube type: Oral Tube size: 7.0 mm Number of attempts: 1 Airway Equipment and Method: Stylet and Bite block Placement Confirmation: ETT inserted through vocal cords under direct vision,  positive ETCO2 and breath sounds checked- equal and bilateral Secured at: 20 (right lip) cm Tube secured with: Tape Dental Injury: Teeth and Oropharynx as per pre-operative assessment

## 2016-01-12 NOTE — Brief Op Note (Signed)
01/12/2016  10:27 AM  PATIENT:  [name]  50 y.o. female  PRE-OPERATIVE DIAGNOSIS:  Failed Endometrial Ablation, Adenomyosis, Dysmenorrhea  POST-OPERATIVE DIAGNOSIS:  Failed Endometrial Ablation, Adenomyosis, Dysmenorrhea  PROCEDURE: Da vinci robotic total hysterectomy, bilateral salpingectomy SURGEON:  Surgeon(s) and Role:    * Servando Salina, MD - Primary  PHYSICIAN ASSISTANT:   ASSISTANTS: Julianne Handler, CNM   ANESTHESIA:   general  EBL:  Total I/O In: 1700 [I.V.:1700] Out: 550 [Urine:500; Blood:50]  BLOOD ADMINISTERED:none  DRAINS: none   LOCAL MEDICATIONS USED:  MARCAINE    and OTHER ropivaciane  SPECIMEN:  Source of Specimen:  uterus with cervix, tubes  DISPOSITION OF SPECIMEN:  PATHOLOGY  COUNTS:  YES  TOURNIQUET:  * No tourniquets in log *  DICTATION: .Other Dictation: Dictation Number [id number]  PLAN OF CARE: Admit for overnight observation  PATIENT DISPOSITION:  PACU - hemodynamically stable.   Delay start of Pharmacological VTE agent (>24hrs) due to surgical blood loss or risk of bleeding: no

## 2016-01-13 ENCOUNTER — Encounter (HOSPITAL_COMMUNITY): Payer: Self-pay | Admitting: Obstetrics and Gynecology

## 2016-01-13 DIAGNOSIS — N800 Endometriosis of uterus: Secondary | ICD-10-CM | POA: Diagnosis not present

## 2016-01-13 MED ORDER — OXYCODONE-ACETAMINOPHEN 5-325 MG PO TABS: 1.0000 | ORAL_TABLET | ORAL | Status: DC | PRN

## 2016-01-13 MED ORDER — IBUPROFEN 800 MG PO TABS: 800.0000 mg | ORAL_TABLET | Freq: Three times a day (TID) | ORAL | Status: DC | PRN

## 2016-01-13 NOTE — Progress Notes (Signed)
Subjective: Patient reports tolerating PO, + flatus and no problems voiding.    Objective: I have reviewed patient's vital signs.  vital signs, intake and output and labs. Filed Vitals:   01/13/16 0119 01/13/16 0545  BP: 127/62 99/53  Pulse: 88 62  Temp: 99.3 F (37.4 C) 98.8 F (37.1 C)  Resp: 16 14   I/O last 3 completed shifts: In: 4469.2 [P.O.:1440; I.V.:3029.2] Out: 1550 [Urine:1500; Blood:50]    Lab Results  Component Value Date   WBC 7.6 01/12/2016   HGB 12.3 01/12/2016   HCT 36.8 01/12/2016   MCV 84.4 01/12/2016   PLT 218 01/12/2016   Lab Results  Component Value Date   CREATININE 0.91 01/12/2016    EXAM General: alert, cooperative and no distress Resp: clear to auscultation bilaterally Cardio: regular rate and rhythm, S1, S2 normal, no murmur, click, rub or gallop GI: soft, non-tender; bowel sounds normal; no masses,  no organomegaly and incision: clean, dry and intact Extremities: no edema, redness or tenderness in the calves or thighs Vaginal Bleeding: none Back no cvat Assessment: s/p Procedure(s): ROBOTIC ASSISTED TOTAL HYSTERECTOMY WITH BILATERAL SALPINGECTOMY: stable  Plan: Encourage ambulation Discontinue IV fluids Discharge home  F/u 2 weeks  LOS: 1 day    Linas Stepter A, MD 01/13/2016 8:26 AM    01/13/2016, 8:26 AM

## 2016-01-13 NOTE — Discharge Instructions (Signed)
Call if temperature greater than equal to 100.4, nothing per vagina for 4-6 weeks or severe nausea vomiting, increased incisional pain , drainage or redness in the incision site, no straining with bowel movements, showers no bath °

## 2016-01-13 NOTE — Discharge Summary (Signed)
Physician Discharge Summary  Patient ID: [name] MRN: [id number] DOB/AGE: 07/19/1966 50 y.o.  Admit date: 01/12/2016 Discharge date: 01/13/2016  Admission Diagnoses: Failed endometrial ablation, dysmenorrhea, adenomyosis  Discharge Diagnoses: same   Active Problems:   S/P hysterectomy   Discharged Condition: stable  Hospital Course: pt underwent da Vinci robotic total hysterectomy, bilateral salpingectomy. Pt was  Placed on track for same day discharge but declined to go home. Uncomplicated postop course  Consults: None  Significant Diagnostic Studies: labs:  CBC    Component Value Date/Time   WBC 7.6 01/12/2016 1748   RBC 4.36 01/12/2016 1748   HGB 12.3 01/12/2016 1748   HCT 36.8 01/12/2016 1748   PLT 218 01/12/2016 1748   MCV 84.4 01/12/2016 1748   MCH 28.2 01/12/2016 1748   MCHC 33.4 01/12/2016 1748   RDW 13.9 01/12/2016 1748    BMET    Component Value Date/Time   NA 139 01/12/2016 1748   K 4.6 01/12/2016 1748   CL 107 01/12/2016 1748   CO2 24 01/12/2016 1748   GLUCOSE 152* 01/12/2016 1748   BUN 10 01/12/2016 1748   CREATININE 0.91 01/12/2016 1748   CALCIUM 8.9 01/12/2016 1748   GFRNONAA >60 01/12/2016 1748   GFRAA >60 01/12/2016 1748     Treatments: surgery:  davinci robotic total hysterectomy, bilateral salpingectomy  Discharge Exam: Blood pressure 99/53, pulse 62, temperature 98.8 F (37.1 C), temperature source Oral, resp. rate 14, height 5\' 5"  (1.651 m), weight 182 lb (82.555 kg), SpO2 100 %. General appearance: alert, cooperative and no distress Back: no tenderness to percussion or palpation Resp: clear to auscultation bilaterally Cardio: regular rate and rhythm, S1, S2 normal, no murmur, click, rub or gallop GI: soft, non-tender; bowel sounds normal; no masses,  no organomegaly Pelvic: deferred Extremities: no edema, redness or tenderness in the calves or thighs Skin: Skin color, texture, turgor normal. No rashes or  lesions Incision/Wound: dry/clean/intact  Disposition: 01-Home or Self Care  Discharge Instructions    Diet general    Complete by:  As directed      Discharge patient    Complete by:  As directed      May walk up steps    Complete by:  As directed      No dressing needed    Complete by:  As directed      No wound care    Complete by:  As directed             Medication List    TAKE these medications        FLAX SEEDS PO  Take 1 tablet by mouth daily.     ibuprofen 800 MG tablet  Commonly known as:  ADVIL,MOTRIN  Take 1 tablet (800 mg total) by mouth every 8 (eight) hours as needed (mild pain).     MULTIVITAMIN PO  Take by mouth.     OVER THE COUNTER MEDICATION  Take 1 tablet by mouth daily. Patient takes Hair, Skin and nail vitamin     oxyCODONE-acetaminophen 5-325 MG tablet  Commonly known as:  PERCOCET/ROXICET  Take 1-2 tablets by mouth every 4 (four) hours as needed for severe pain (moderate to severe pain (when tolerating fluids)).           Follow-up Information    Follow up with Mikya Don A, MD In 2 weeks.   Specialty:  Obstetrics and Gynecology   Contact information:   [address] [place] [address] [phone]  Signed: Salote Weidmann A 01/13/2016, 8:27 AM

## 2016-01-13 NOTE — Progress Notes (Signed)
Patient discharged home with husband... Discharge instructions reviewed with patient and she verbalized understanding... Condition stable... No equipment... Taken to car via wheelchair by C. Riley, NT.  

## 2016-01-14 NOTE — Op Note (Signed)
nnnnnnnnnnnnnnnNAMEJENNISE, [name]     ACCOUNT NO.:  [id number]  MEDICAL RECORD NO.:  [id number]  LOCATION:  9306                          FACILITY:  [facility]  PHYSICIAN:  Servando Salina, M.D.DATE OF BIRTH:  August 11, 1966  DATE OF PROCEDURE:  01/12/2016 DATE OF DISCHARGE:  01/13/2016                              OPERATIVE REPORT   PREOPERATIVE DIAGNOSES:  Failed endometrial ablation, adenomyosis, dysmenorrhea.  PROCEDURE:  Da Vinci robotic assisted total hysterectomy, bilateral salpingectomy.  POSTOPERATIVE DIAGNOSES:  Failed endometrial ablation, adenomyosis, dysmenorrhea.  ANESTHESIA:  General.  SURGEON:  Servando Salina, MD  ASSISTANT:  Julianne Handler, CNM  DESCRIPTION OF PROCEDURE:  Under adequate general anesthesia, the patient was positioned for robotic surgery.  She was placed in the dorsal lithotomy position.  Examination under anesthesia revealed anteverted uterus.  No adnexal masses could be appreciated.  The patient was sterilely prepped and draped in usual fashion.  A 3-way Foley catheter was sterilely placed.  A weighted speculum was placed in vagina.  Sims retractor was placed anteriorly.  0 Vicryl figure-of-eight suture was placed on anterior and on the posterior lip of the cervix. The uterus sounded to 9 cm.  The cervix was then carefully dilated and a #8 uterine manipulator with a medium KOH ring was introduced into the cavity.  The retractors were then removed.  Attention was then turned to the abdomen.  A small supraumbilical incision was then made.  Veress needle was introduced, tested with normal saline and subsequently 2.6 L of CO2 was insufflated at an opening pressure of 7.  Veress needle was then removed.  A 12 mm disposable trocar with sleeve was introduced into the abdomen without incident.  The robotic camera was then placed. Panoramic inspection was notable for no evidence of trauma entering  the cavity.  Normal liver edge.  The patient was placed in Trendelenburg and  the pelvis was able to be inspected.  The bladder had been distended and after adjustment of the Foley, the bladder was able to be decompressed.  The uterus was appeared to be slightly enlarged. Bilaterally the junction of the fallopian tube had evidence of prior sterilization.  On the right side, the ovary was attached posteriorly to the ovarian fossa and had some adhesions around that tube as well as the ovary itself.  In the posterior inferior between the uterosacral ligament, was a quarter-sized collection of dilated vessels suggestive of pelvic varicosities.  No other lesions were noted.  At that point, the additional robotic port sites were then placed, 2 on the left, 1 on the right and an 8 mm AirSeal assistant port was placed.  The robot was then docked to the patient's left side and in arm #1, the monopolar scissors; arm #2, the PK dissector and arm #3 ProGrasp.  With every placement in place, I then went to the surgical console.  At the surgical console, the pelvis was once again inspected.  The procedure was started on the left with a 3rd arm grasping the fallopian tube.  Underlying mesosalpinx was serially clamped, cauterized, and cut.  The left retroperitoneal space was then opened.  A window was placed superior to the visible ureter that was peristalsing.  The left utero-ovarian ligament was serially clamped, cauterized, and  then cut.  The posterior leaf of the broad ligament was then carried down inferiorly to displace the ureter inferiorly and further down in the pelvis.  The round ligament was then serially clamped, cauterized, and then cut.  The anterior leaf of the broad ligament was then opened, carried around to the vesicouterine peritoneum which was then opened transversely and the bladder sharply dissected off the upper part of the KOH ring.  The uterine vessels were then partially  skeletonized, clamped, cauterized, but not cut. Attention turned to the opposite side after releasing the ovary from its posterior attachment as well as the surrounding adhesions to the right tube.  The right tube was then grasped again and the underlying mesosalpinx was serially clamped, cauterized, and then cut with the tube being removed.  The retroperitoneal space was then opened on the right. The ureter was noted to be deeply peristalsing.  A window was placed in the posterior leaf and the right utero-ovarian ligament was serially clamped, cauterized, and cut.  That area was particularly thickened at the junction of the uterus where there were adhesions noted.  Once that area was moved out of the field, the remaining portion of the vesicouterine peritoneum was opened transversely.  The bladder was displaced further inferiorly.  Posteriorly, the posterior leaf of the broad ligament was opened.  The uterine vessels were then noted, serially clamped, cauterized, and cut.  On the opposite side, the uterine vessels were once again clamped, cauterized, and then cut. Posteriorly, the collection of varicosities that had been noted was serially cauterized to blanching of that area.  Once this was done, the vaginal insufflator was done.  The cervicovaginal junction at the upper part of the cuff was opened transversely and carried around circumferentially with detachment of the cervix from the vagina.  The uterus was then removed with the cervix.  The vaginal cuff was inspected.  Small bleeders cauterized.  The bladder was further displaced inferiorly, replacing the arm #1 and arm #2 with large needle driver and long-tipped forceps was done respectively, 0 V-Loc sutures x2 was placed and the vaginal cuff was closed with running suture of the V- Loc bilaterally.  The area of the pelvis was then irrigated and suctioned.  Ureter was noted to be peristalsing.  The adnexa was without any area of  concern.  At that point, the robotic instruments were removed.  The robot was undocked.  I went back sterilely to the patient's bedside.  Panoramic inspection showed no other issues of concern.  At that point, ropivacaine was injected in the pelvis and the ports were all removed and the AirSeal removed, at the last, the deflation of the abdomen.  The incisions were closed with the fascia of 0 Vicryl figure-of-eight suture and the subcuticular area with 4-0 Vicryl.  Intraoperative palpation of vaginal cuff postoperatively showed good approximated vaginal cuff.  Specimen was uterus, cervix, fallopian tubes sent to Pathology.  Estimated blood loss was 50 mL. Intraoperative fluid was 1700 mL.  Urine output was 500 mL.  Sponge and instrument counts x2 was correct.  Complication was none.  The patient tolerated the procedure well, was transferred to recovery room in stable condition.     Servando Salina, M.D.     [place]/MEDQ  D:  01/13/2016  T:  01/14/2016  Job:  [id number]

## 2016-01-18 NOTE — Anesthesia Postprocedure Evaluation (Signed)
Anesthesia Post Note  Patient: [name]  Procedure(s) Performed: Procedure(s) (LRB): ROBOTIC ASSISTED TOTAL HYSTERECTOMY WITH BILATERAL SALPINGECTOMY (Bilateral)  Patient location during evaluation: PACU Anesthesia Type: General Level of consciousness: awake Pain management: pain level controlled Vital Signs Assessment: post-procedure vital signs reviewed and stable Respiratory status: spontaneous breathing Cardiovascular status: stable Postop Assessment: no signs of nausea or vomiting Anesthetic complications: no    Last Vitals:  Filed Vitals:   01/13/16 0119 01/13/16 0545  BP: 127/62 99/53  Pulse: 88 62  Temp: 37.4 C 37.1 C  Resp: 16 14    Last Pain:  Filed Vitals:   01/13/16 0847  PainSc: 0-No pain                 Ahtziri Jeffries

## 2016-07-18 ENCOUNTER — Other Ambulatory Visit: Payer: Self-pay | Admitting: Obstetrics

## 2016-07-18 DIAGNOSIS — Z1231 Encounter for screening mammogram for malignant neoplasm of breast: Secondary | ICD-10-CM

## 2016-07-30 ENCOUNTER — Ambulatory Visit
Admission: RE | Admit: 2016-07-30 | Discharge: 2016-07-30 | Disposition: A | Discharge status: Home / Self Care | Payer: 59 | Source: Ambulatory Visit | Attending: Obstetrics | Admitting: Obstetrics

## 2016-07-30 DIAGNOSIS — Z1231 Encounter for screening mammogram for malignant neoplasm of breast: Secondary | ICD-10-CM

## 2016-12-05 ENCOUNTER — Ambulatory Visit (INDEPENDENT_AMBULATORY_CARE_PROVIDER_SITE_OTHER): Payer: 59 | Admitting: Medical

## 2016-12-05 ENCOUNTER — Encounter: Payer: Self-pay | Admitting: Medical

## 2016-12-05 VITALS — BP 116/90 | HR 74 | Temp 98.0°F | Resp 16 | Ht 63.75 in | Wt 184.0 lb

## 2016-12-05 DIAGNOSIS — J0100 Acute maxillary sinusitis, unspecified: Secondary | ICD-10-CM | POA: Diagnosis not present

## 2016-12-05 DIAGNOSIS — J209 Acute bronchitis, unspecified: Secondary | ICD-10-CM

## 2016-12-05 MED ORDER — BENZONATATE 100 MG PO CAPS: 100.0000 mg | ORAL_CAPSULE | Freq: Three times a day (TID) | ORAL | 0 refills | Status: DC | PRN

## 2016-12-05 MED ORDER — FLUTICASONE PROPIONATE 50 MCG/ACT NA SUSP: 2.0000 | Freq: Every day | NASAL | 1 refills | Status: DC

## 2016-12-05 MED ORDER — AZITHROMYCIN 250 MG PO TABS: ORAL_TABLET | ORAL | 0 refills | Status: DC

## 2016-12-05 NOTE — Progress Notes (Signed)
Pre visit review using our clinic review tool, if applicable. No additional management support is needed unless otherwise documented below in the visit note/SLS  

## 2016-12-05 NOTE — Patient Instructions (Addendum)
You appear to have bronchitis and sinusitis following one month uri symptoms. Rest hydrate and tylenol for fever. I am prescribing cough medicine benzonatate, and azithromycin antibiotic. For your nasal congestion rx flonase nasal steroid.  You should gradually get better. If not then notify us and would recommend a chest xray.  Follow up in 7-10 days or as needed  Offered cpe in February if she chooses/wants.

## 2016-12-05 NOTE — Progress Notes (Signed)
Subjective:    Patient ID: [name], female    DOB: [birth year], 49 y.o.   MRN: [id number]  HPI   I have reviewed pt PMH, PSH, FH, Social History and Surgical History  Pt makes cell phone manufacture, Works out 2 times a week, moderate healthy but some fried foods. She likes salads, eats fruits and vegetable. Married- 19 daugther 48 yo.  Pt in with one month with nasal and chest congestion for one month. Pt states had symptoms for 2 wks at first then took robutussin and felt good for one week and last week recurrent symptoms including ear pressure, sinus pressure, st with cough and productive thick green mucous.   No fever, no chills, no sweats and no body aches.   Review of Systems  Constitutional: Negative for chills, fatigue and fever.  HENT: Positive for congestion, ear pain, postnasal drip, sinus pain, sinus pressure and sore throat. Negative for trouble swallowing.   Respiratory: Positive for cough. Negative for chest tightness, shortness of breath and wheezing.   Cardiovascular: Negative for chest pain and palpitations.  Gastrointestinal: Negative for abdominal pain, constipation and diarrhea.  Genitourinary: Negative for dysuria, frequency, pelvic pain, urgency and vaginal pain.  Musculoskeletal: Negative for back pain and myalgias.  Skin: Negative for rash.  Neurological: Negative for dizziness and headaches.  Hematological: Negative for adenopathy. Does not bruise/bleed easily.  Psychiatric/Behavioral: Negative for behavioral problems and confusion. The patient is not nervous/anxious.     Past Medical History:  Diagnosis Date  . Fibroids    patient denies fibroids  . Fibroids    patient denies fibroids  . Hay fever    Seasonal Allergies  . PONV (postoperative nausea and vomiting)      Social History   Social History  . Marital status: Married    Spouse name: N/A  . Number of children: N/A  . Years of education: N/A   Occupational History  .  Not on file.   Social History Main Topics  . Smoking status: Never Smoker  . Smokeless tobacco: Never Used  . Alcohol use No  . Drug use: No  . Sexual activity: Yes    Birth control/ protection: Surgical   Other Topics Concern  . Not on file   Social History Narrative  . No narrative on file    Past Surgical History:  Procedure Laterality Date  . nova sure    . ROBOTIC ASSISTED TOTAL HYSTERECTOMY WITH SALPINGECTOMY Bilateral 01/12/2016   Procedure: ROBOTIC ASSISTED TOTAL HYSTERECTOMY WITH BILATERAL SALPINGECTOMY;  Surgeon: Servando Salina, MD;  Location: [facility];  Service: Gynecology;  Laterality: Bilateral;  3 hrs.  . TUBAL LIGATION    . WISDOM TOOTH EXTRACTION      Family History  Problem Relation Age of Onset  . Hypertension Mother   . Angina Mother   . Fibromyalgia Mother   . Asthma Mother   . Depression Mother   . Diabetes Father   . Depression Brother   . Post-traumatic stress disorder Brother   . Breast cancer Maternal Aunt     Great Aunt, Maternal  . Allergies Daughter     Allergies  Allergen Reactions  . Codeine Other (See Comments)    Dizziness     Current Outpatient Prescriptions on File Prior to Visit  Medication Sig Dispense Refill  . Flaxseed, Linseed, (FLAX SEEDS PO) Take 1 tablet by mouth daily.    Marland Kitchen ibuprofen (ADVIL,MOTRIN) 800 MG tablet Take 1 tablet (800 mg total) by mouth  every 8 (eight) hours as needed (mild pain). 30 tablet 0  . Multiple Vitamins-Minerals (MULTIVITAMIN PO) Take by mouth. Women's One-A-Day    . OVER THE COUNTER MEDICATION Take 1 tablet by mouth daily. Patient takes Biotin: Hair, Skin and nail vitamin     No current facility-administered medications on file prior to visit.     BP 116/90 (BP Location: Left Arm, Patient Position: Sitting, Cuff Size: Large)   Pulse 74   Temp 98 F (36.7 C) (Oral)   Resp 16   Ht 5' 3.75" (1.619 m)   Wt 184 lb (83.5 kg)   LMP 12/24/2015 (Exact Date) Comment: January 2017  SpO2 99%    BMI 31.83 kg/m      Objective:   Physical Exam  General  Mental Status - Alert. General Appearance - Well groomed. Not in acute distress.  Skin Rashes- No Rashes.  HEENT Head- Normal. Ear Auditory Canal - Left- Normal. Right - Normal.Tympanic Membrane- Left- Normal. Right- tm some wax. Eye Sclera/Conjunctiva- Left- Normal. Right- Normal. Nose & Sinuses Nasal Mucosa- Left-  Boggy and Congested. Right-  Boggy and  Congested.Bilateral  No maxillary and no  frontal sinus pressure.(but reports for one month) Mouth & Throat Lips: Upper Lip- Normal: no dryness, cracking, pallor, cyanosis, or vesicular eruption. Lower Lip-Normal: no dryness, cracking, pallor, cyanosis or vesicular eruption. Buccal Mucosa- Bilateral- No Aphthous ulcers. Oropharynx- No Discharge or Erythema. Tonsils: Characteristics- Bilateral- No Erythema or Congestion. Size/Enlargement- Bilateral- No enlargement. Discharge- bilateral-None.  Neck Neck- Supple. No Masses.   Chest and Lung Exam Auscultation: Breath Sounds:-Clear even and unlabored.  Cardiovascular Auscultation:Rythm- Regular, rate and rhythm. Murmurs & Other Heart Sounds:Ausculatation of the heart reveal- No Murmurs.  Lymphatic Head & Neck General Head & Neck Lymphatics: Bilateral: Description- No Localized lymphadenopathy.       Assessment & Plan:   You appear to have bronchitis and sinusitis following one month uri symptoms. Rest hydrate and tylenol for fever. I am prescribing cough medicine benzonatate, and azithromycin antibiotic. For your nasal congestion rx flonase nasal steroid.  You should gradually get better. If not then notify us and would recommend a chest xray.  Follow up in 7-10 days or as needed  Offered cpe in February if she chooses/wants.

## 2017-04-26 ENCOUNTER — Encounter: Payer: Self-pay | Admitting: Medical

## 2017-04-26 ENCOUNTER — Telehealth: Payer: Self-pay | Admitting: Medical

## 2017-04-26 ENCOUNTER — Ambulatory Visit (INDEPENDENT_AMBULATORY_CARE_PROVIDER_SITE_OTHER): Payer: 59 | Admitting: Medical

## 2017-04-26 VITALS — BP 121/80 | HR 76 | Temp 98.2°F | Resp 16 | Ht 64.0 in | Wt 178.0 lb

## 2017-04-26 DIAGNOSIS — Z0000 Encounter for general adult medical examination without abnormal findings: Secondary | ICD-10-CM

## 2017-04-26 DIAGNOSIS — Z113 Encounter for screening for infections with a predominantly sexual mode of transmission: Secondary | ICD-10-CM

## 2017-04-26 LAB — COMPREHENSIVE METABOLIC PANEL
ALT: 23 U/L (ref 0–35)
AST: 18 U/L (ref 0–37)
Albumin: 4.1 g/dL (ref 3.5–5.2)
Alkaline Phosphatase: 50 U/L (ref 39–117)
BUN: 13 mg/dL (ref 6–23)
CALCIUM: 9.5 mg/dL (ref 8.4–10.5)
CHLORIDE: 104 meq/L (ref 96–112)
CO2: 29 meq/L (ref 19–32)
Creatinine, Ser: 0.83 mg/dL (ref 0.40–1.20)
GFR: 93.97 mL/min (ref >60.00)
Glucose, Bld: 87 mg/dL (ref 70–99)
Potassium: 4.1 mEq/L (ref 3.5–5.1)
Sodium: 138 mEq/L (ref 135–145)
Total Bilirubin: 0.3 mg/dL (ref 0.2–1.2)
Total Protein: 7.4 g/dL (ref 6.0–8.3)

## 2017-04-26 LAB — CBC WITH DIFFERENTIAL/PLATELET
BASOS PCT: 0.4 % (ref 0.0–3.0)
Basophils Absolute: 0 10*3/uL (ref 0.0–0.1)
EOS ABS: 0.3 10*3/uL (ref 0.0–0.7)
Eosinophils Relative: 5.4 % — ABNORMAL HIGH (ref 0.0–5.0)
HCT: 40.3 % (ref 36.0–46.0)
Hemoglobin: 13.3 g/dL (ref 12.0–15.0)
LYMPHS ABS: 1.7 10*3/uL (ref 0.7–4.0)
Lymphocytes Relative: 33.8 % (ref 12.0–46.0)
MCHC: 33.1 g/dL (ref 30.0–36.0)
MCV: 84.7 fl (ref 78.0–100.0)
Monocytes Absolute: 0.3 10*3/uL (ref 0.1–1.0)
Monocytes Relative: 6.2 % (ref 3.0–12.0)
NEUTROS ABS: 2.7 10*3/uL (ref 1.4–7.7)
NEUTROS PCT: 54.2 % (ref 43.0–77.0)
PLATELETS: 264 10*3/uL (ref 150.0–400.0)
RBC: 4.75 Mil/uL (ref 3.87–5.11)
RDW: 13.6 % (ref 11.5–15.5)
WBC: 4.9 10*3/uL (ref 4.0–10.5)

## 2017-04-26 LAB — TSH: TSH: 0.99 u[IU]/mL (ref 0.35–4.50)

## 2017-04-26 MED ORDER — IBUPROFEN 800 MG PO TABS: 800.0000 mg | ORAL_TABLET | Freq: Three times a day (TID) | ORAL | 0 refills | Status: DC | PRN

## 2017-04-26 MED ORDER — AZELASTINE HCL 0.1 % NA SOLN: 2.0000 | Freq: Two times a day (BID) | NASAL | 3 refills | Status: DC

## 2017-04-26 NOTE — Patient Instructions (Addendum)
For you wellness exam today I have ordered cbc, cmp, tsh, lipid panel, hiv and ua.  Vaccine given today.   Recommend exercise and healthy diet.  We will let you know lab results as they come in.  Follow up date appointment will be determined after lab review.   For allergies added astelin. If you mood worsens with work stress let me know and can call in effexor.    Preventive Care 40-64 Years, Female Preventive care refers to lifestyle choices and visits with your health care provider that can promote health and wellness. What does preventive care include?  A yearly physical exam. This is also called an annual well check.  Dental exams once or twice a year.  Routine eye exams. Ask your health care provider how often you should have your eyes checked.  Personal lifestyle choices, including:  Daily care of your teeth and gums.  Regular physical activity.  Eating a healthy diet.  Avoiding tobacco and drug use.  Limiting alcohol use.  Practicing safe sex.  Taking low-dose aspirin daily starting at age 37.  Taking vitamin and mineral supplements as recommended by your health care provider. What happens during an annual well check? The services and screenings done by your health care provider during your annual well check will depend on your age, overall health, lifestyle risk factors, and family history of disease. Counseling  Your health care provider may ask you questions about your:  Alcohol use.  Tobacco use.  Drug use.  Emotional well-being.  Home and relationship well-being.  Sexual activity.  Eating habits.  Work and work Statistician.  Method of birth control.  Menstrual cycle.  Pregnancy history. Screening  You may have the following tests or measurements:  Height, weight, and BMI.  Blood pressure.  Lipid and cholesterol levels. These may be checked every 5 years, or more frequently if you are over 11 years old.  Skin check.  Lung  cancer screening. You may have this screening every year starting at age 44 if you have a 30-pack-year history of smoking and currently smoke or have quit within the past 15 years.  Fecal occult blood test (FOBT) of the stool. You may have this test every year starting at age 74.  Flexible sigmoidoscopy or colonoscopy. You may have a sigmoidoscopy every 5 years or a colonoscopy every 10 years starting at age 101.  Hepatitis C blood test.  Hepatitis B blood test.  Sexually transmitted disease (STD) testing.  Diabetes screening. This is done by checking your blood sugar (glucose) after you have not eaten for a while (fasting). You may have this done every 1-3 years.  Mammogram. This may be done every 1-2 years. Talk to your health care provider about when you should start having regular mammograms. This may depend on whether you have a family history of breast cancer.  BRCA-related cancer screening. This may be done if you have a family history of breast, ovarian, tubal, or peritoneal cancers.  Pelvic exam and Pap test. This may be done every 3 years starting at age 71. Starting at age 26, this may be done every 5 years if you have a Pap test in combination with an HPV test.  Bone density scan. This is done to screen for osteoporosis. You may have this scan if you are at high risk for osteoporosis. Discuss your test results, treatment options, and if necessary, the need for more tests with your health care provider. Vaccines  Your health care provider may  recommend certain vaccines, such as:  Influenza vaccine. This is recommended every year.  Tetanus, diphtheria, and acellular pertussis (Tdap, Td) vaccine. You may need a Td booster every 10 years.  Varicella vaccine. You may need this if you have not been vaccinated.  Zoster vaccine. You may need this after age 34.  Measles, mumps, and rubella (MMR) vaccine. You may need at least one dose of MMR if you were born in 1957 or later. You  may also need a second dose.  Pneumococcal 13-valent conjugate (PCV13) vaccine. You may need this if you have certain conditions and were not previously vaccinated.  Pneumococcal polysaccharide (PPSV23) vaccine. You may need one or two doses if you smoke cigarettes or if you have certain conditions.  Meningococcal vaccine. You may need this if you have certain conditions.  Hepatitis A vaccine. You may need this if you have certain conditions or if you travel or work in places where you may be exposed to hepatitis A.  Hepatitis B vaccine. You may need this if you have certain conditions or if you travel or work in places where you may be exposed to hepatitis B.  Haemophilus influenzae type b (Hib) vaccine. You may need this if you have certain conditions. Talk to your health care provider about which screenings and vaccines you need and how often you need them. This information is not intended to replace advice given to you by your health care provider. Make sure you discuss any questions you have with your health care provider. Document Released: 12/30/2015 Document Revised: 08/22/2016 Document Reviewed: 10/04/2015 Elsevier Interactive Patient Education  2017 [employer].

## 2017-04-26 NOTE — Telephone Encounter (Signed)
poct ua added today for wellness. Lab staff will result.

## 2017-04-26 NOTE — Progress Notes (Signed)
Subjective:    Patient ID: [name], female    DOB: 1966/01/17, 51 y.o.   MRN: [id number]  HPI   I have reviewed pt PMH, PSH, FH, Social History and Surgical History.  Pt here for physical today.  Pt fasting.  Pt up to date on tetanus vaccine. Pt up dates me hysterectomy last year. Pt did not  Get ovaries removed.  Last pap smear was normal. Next due 2020. Last mammogram August and normal.   Pt ok with hiv screen.   Pt trying to eat healthy, might exercise more.     Review of Systems  Constitutional: Negative for chills, fatigue and fever.  HENT: Positive for congestion. Negative for hearing loss, postnasal drip, rhinorrhea, sinus pressure and sneezing.        On zyrtec for her allergies.  Respiratory: Negative for cough, choking, shortness of breath and wheezing.   Cardiovascular: Negative for chest pain and palpitations.  Gastrointestinal: Negative for abdominal pain.  Genitourinary: Negative for decreased urine volume, difficulty urinating, dysuria, flank pain, frequency and vaginal pain.  Musculoskeletal: Negative for arthralgias and gait problem.  Skin: Negative for rash.  Neurological: Negative for facial asymmetry, weakness and headaches.  Hematological: Negative for adenopathy. Does not bruise/bleed easily.  Psychiatric/Behavioral: Positive for dysphoric mood. Negative for behavioral problems and confusion.       Pt has a lot of stress at work. A lot changes. Reduced work force. Some anxiety mid day.     Past Medical History:  Diagnosis Date  . Fibroids    patient denies fibroids  . Fibroids    patient denies fibroids  . Hay fever    Seasonal Allergies  . PONV (postoperative nausea and vomiting)      Social History   Social History  . Marital status: Married    Spouse name: N/A  . Number of children: N/A  . Years of education: N/A   Occupational History  . Not on file.   Social History Main Topics  . Smoking status: Never Smoker    . Smokeless tobacco: Never Used  . Alcohol use No  . Drug use: No  . Sexual activity: Yes    Birth control/ protection: Surgical   Other Topics Concern  . Not on file   Social History Narrative  . No narrative on file    Past Surgical History:  Procedure Laterality Date  . nova sure    . ROBOTIC ASSISTED TOTAL HYSTERECTOMY WITH SALPINGECTOMY Bilateral 01/12/2016   Procedure: ROBOTIC ASSISTED TOTAL HYSTERECTOMY WITH BILATERAL SALPINGECTOMY;  Surgeon: Servando Salina, MD;  Location: [facility];  Service: Gynecology;  Laterality: Bilateral;  3 hrs.  . TUBAL LIGATION    . WISDOM TOOTH EXTRACTION      Family History  Problem Relation Age of Onset  . Hypertension Mother   . Angina Mother   . Fibromyalgia Mother   . Asthma Mother   . Depression Mother   . Diabetes Father   . Depression Brother   . Post-traumatic stress disorder Brother   . Breast cancer Maternal Aunt        Great Aunt, Maternal  . Allergies Daughter     Allergies  Allergen Reactions  . Codeine Other (See Comments)    Dizziness     Current Outpatient Prescriptions on File Prior to Visit  Medication Sig Dispense Refill  . Flaxseed, Linseed, (FLAX SEEDS PO) Take 1 tablet by mouth daily.    . fluticasone (FLONASE) 50 MCG/ACT nasal spray Place  2 sprays into both nostrils daily. 16 g 1  . ibuprofen (ADVIL,MOTRIN) 800 MG tablet Take 1 tablet (800 mg total) by mouth every 8 (eight) hours as needed (mild pain). 30 tablet 0  . Multiple Vitamins-Minerals (MULTIVITAMIN PO) Take by mouth. Women's One-A-Day     No current facility-administered medications on file prior to visit.     BP 121/80 (BP Location: Left Arm, Patient Position: Sitting, Cuff Size: Normal)   Pulse 76   Temp 98.2 F (36.8 C) (Oral)   Resp 16   Ht 5\' 4"  (1.626 m)   Wt 178 lb (80.7 kg)   LMP 12/24/2015 (Exact Date) Comment: January 2017  SpO2 98%   BMI 30.55 kg/m       Objective:   Physical Exam  General Mental Status- Alert.  General Appearance- Not in acute distress.   Skin General: Color- Normal Color. Moisture- Normal Moisture.  Neck Carotid Arteries- Normal color. Moisture- Normal Moisture. No carotid bruits. No JVD.  Chest and Lung Exam Auscultation: Breath Sounds:-Normal.  Cardiovascular Auscultation:Rythm- Regular. Murmurs & Other Heart Sounds:Auscultation of the heart reveals- No Murmurs.  Abdomen Inspection:-Inspeection Normal. Palpation/Percussion:Note:No mass. Palpation and Percussion of the abdomen reveal- Non Tender, Non Distended + BS, no rebound or guarding.   Neurologic Cranial Nerve exam:- CN III-XII intact(No nystagmus), symmetric smile. Strength:- 5/5 equal and symmetric strength both upper and lower extremities.      Assessment & Plan:  For you wellness exam today I have ordered cbc, cmp, tsh, lipid panel, hiv and ua.  Vaccine given today.   Recommend exercise and healthy diet.  We will let you know lab results as they come in.  Follow up date appointment will be determined after lab review.   For allergies added astelin. If you mood worsens with work stress let me know and can call in effexor.  Adeeb Konecny, Percell Miller, PA-C

## 2017-04-27 LAB — HIV ANTIBODY (ROUTINE TESTING W REFLEX): HIV 1&2 Ab, 4th Generation: NONREACTIVE

## 2017-05-01 ENCOUNTER — Telehealth: Payer: Self-pay | Admitting: Medical

## 2017-05-01 NOTE — Telephone Encounter (Signed)
Pt brought [facility] dx physician results form to be filled out by [employer]. Please fax form [phone]. Please call pt to confirm it has been done [phone]. Placed form in front office tray.

## 2017-05-03 NOTE — Telephone Encounter (Signed)
Form received but patient needs Lipid and A1C labs required and page for Office Information to be entered [address, phone number, etc], as stated on page with Healthcare Provider Instructions was not received; forwarded to provider/SLS

## 2017-05-07 ENCOUNTER — Telehealth: Payer: Self-pay | Admitting: Medical

## 2017-05-07 DIAGNOSIS — Z0000 Encounter for general adult medical examination without abnormal findings: Secondary | ICD-10-CM

## 2017-05-07 DIAGNOSIS — Z833 Family history of diabetes mellitus: Secondary | ICD-10-CM

## 2017-05-07 NOTE — Telephone Encounter (Signed)
Future lipid panel and a1c placed. The form states they want her to fast for at least 9 hours.

## 2017-05-07 NOTE — Telephone Encounter (Signed)
Pt had form for me to fill out for her wellness exam. I put in order for lipid panel that day. But epic pre-populates lipid panel as future occasionally. Then lab did not draw lab that day although other labs were one. Also I don't routinely order a1c on wellness exam. The form states a1-c needs to be filled out. So appears she needs to get a1c as well. Will put both orders in . Apologize that order was placed as future. Not sure why epic does that automatically?

## 2017-05-10 NOTE — Telephone Encounter (Signed)
Called patient and made her aware.  She said she would call insurance company to verify that they will cover blood work and then call us back.  Awaiting call back.

## 2017-06-20 ENCOUNTER — Other Ambulatory Visit: Payer: Self-pay | Admitting: Obstetrics and Gynecology

## 2017-06-20 DIAGNOSIS — Z1231 Encounter for screening mammogram for malignant neoplasm of breast: Secondary | ICD-10-CM

## 2017-06-23 ENCOUNTER — Encounter: Payer: Self-pay | Admitting: Medical

## 2017-06-30 ENCOUNTER — Encounter: Payer: Self-pay | Admitting: Medical

## 2017-07-02 ENCOUNTER — Ambulatory Visit (INDEPENDENT_AMBULATORY_CARE_PROVIDER_SITE_OTHER): Payer: 59 | Admitting: Medical

## 2017-07-02 ENCOUNTER — Encounter: Payer: Self-pay | Admitting: Medical

## 2017-07-02 VITALS — BP 133/77 | HR 86 | Temp 98.1°F | Resp 16 | Ht 64.0 in | Wt 170.8 lb

## 2017-07-02 DIAGNOSIS — F419 Anxiety disorder, unspecified: Secondary | ICD-10-CM

## 2017-07-02 DIAGNOSIS — F329 Major depressive disorder, single episode, unspecified: Secondary | ICD-10-CM

## 2017-07-02 DIAGNOSIS — F32A Depression, unspecified: Secondary | ICD-10-CM

## 2017-07-02 MED ORDER — VENLAFAXINE HCL ER 37.5 MG PO CP24: 37.5000 mg | ORAL_CAPSULE | Freq: Every day | ORAL | 0 refills | Status: DC

## 2017-07-02 NOTE — Progress Notes (Signed)
Subjective:    Patient ID: [name], female    DOB: Mar 03, 1966, 51 y.o.   MRN: [id number]  HPI   Pt in for evaluation.   She has been fighting depression and anxious for a while(3-5 months). Has been having work stress and family stress. Pt works 12 hour shifts. Some weeks 3 day shifts. Then next 2 week 2 days. On days off she is very busy and sometimes has to baby sit grand daughter. In past pt denies history of extended depression. Never in past needed medications.Just would have very brief transient episodes of sadness then recover quickly.  Pt states feels tired, sad/ddepressed and anxious. Pt states at times trouble sleeping. Crying easier.  2 months ago normal thyroid, normal cbc and normal cmp.  LMP-  Hysterectomy.  Review of Systems  Constitutional: Negative for chills, fatigue and fever.  Respiratory: Negative for cough, chest tightness, shortness of breath and wheezing.   Cardiovascular: Negative for chest pain and palpitations.  Genitourinary: Negative for dyspareunia, flank pain, genital sores and hematuria.  Musculoskeletal: Negative for back pain and joint swelling.  Skin: Negative for rash.  Neurological: Negative for dizziness, weakness, numbness and headaches.  Psychiatric/Behavioral: Positive for dysphoric mood. Negative for agitation, confusion and suicidal ideas. The patient is nervous/anxious.     Past Medical History:  Diagnosis Date  . Fibroids    patient denies fibroids  . Fibroids    patient denies fibroids  . Hay fever    Seasonal Allergies  . PONV (postoperative nausea and vomiting)      Social History   Social History  . Marital status: Married    Spouse name: N/A  . Number of children: N/A  . Years of education: N/A   Occupational History  . Not on file.   Social History Main Topics  . Smoking status: Never Smoker  . Smokeless tobacco: Never Used  . Alcohol use No  . Drug use: No  . Sexual activity: Yes    Birth  control/ protection: Surgical   Other Topics Concern  . Not on file   Social History Narrative  . No narrative on file    Past Surgical History:  Procedure Laterality Date  . nova sure    . ROBOTIC ASSISTED TOTAL HYSTERECTOMY WITH SALPINGECTOMY Bilateral 01/12/2016   Procedure: ROBOTIC ASSISTED TOTAL HYSTERECTOMY WITH BILATERAL SALPINGECTOMY;  Surgeon: Servando Salina, MD;  Location: [facility];  Service: Gynecology;  Laterality: Bilateral;  3 hrs.  . TUBAL LIGATION    . WISDOM TOOTH EXTRACTION      Family History  Problem Relation Age of Onset  . Hypertension Mother   . Angina Mother   . Fibromyalgia Mother   . Asthma Mother   . Depression Mother   . Diabetes Father   . Depression Brother   . Post-traumatic stress disorder Brother   . Breast cancer Maternal Aunt        Great Aunt, Maternal  . Allergies Daughter     Allergies  Allergen Reactions  . Codeine Other (See Comments)    Dizziness     Current Outpatient Prescriptions on File Prior to Visit  Medication Sig Dispense Refill  . azelastine (ASTELIN) 0.1 % nasal spray Place 2 sprays into both nostrils 2 (two) times daily. Use in each nostril as directed 30 mL 3  . Flaxseed, Linseed, (FLAX SEEDS PO) Take 1 tablet by mouth daily.    . fluticasone (FLONASE) 50 MCG/ACT nasal spray Place 2 sprays into both nostrils  daily. 16 g 1  . ibuprofen (ADVIL,MOTRIN) 800 MG tablet Take 1 tablet (800 mg total) by mouth every 8 (eight) hours as needed (mild pain). 30 tablet 0  . Multiple Vitamins-Minerals (MULTIVITAMIN PO) Take by mouth. Women's One-A-Day     No current facility-administered medications on file prior to visit.     BP 133/77   Pulse 86   Temp 98.1 F (36.7 C) (Oral)   Resp 16   Ht 5\' 4"  (1.626 m)   Wt 170 lb 12.8 oz (77.5 kg)   LMP 12/24/2015 (Exact Date) Comment: January 2017  SpO2 100%   BMI 29.32 kg/m       Objective:   Physical Exam  General Mental Status- Alert. General Appearance- Not in acute  distress.   Skin General: Color- Normal Color. Moisture- Normal Moisture.  Neck Carotid Arteries- Normal color. Moisture- Normal Moisture. No carotid bruits. No JVD.  Chest and Lung Exam Auscultation: Breath Sounds:-Normal.  Cardiovascular Auscultation:Rythm- Regular. Murmurs & Other Heart Sounds:Auscultation of the heart reveals- No Murmurs.  Abdomen Inspection:-Inspeection Normal. Palpation/Percussion:Note:No mass. Palpation and Percussion of the abdomen reveal- Non Tender, Non Distended + BS, no rebound or guarding.  Neurologic Cranial Nerve exam:- CN III-XII intact(No nystagmus), symmetric smile. Strength:- 5/5 equal and symmetric strength both upper and lower extremities.      Assessment & Plan:  For depression and anxiety will rx effexor to take daily. If mood worsens or changes let us know but expect improvement.  Recommend also getting regular exercise and eating healthy. Keep attending church.  Follow up in one month or as needed

## 2017-07-02 NOTE — Patient Instructions (Addendum)
For depression and anxiety will rx effexor to take daily. If mood worsens or changes let us know but expect improvement.  Recommend also getting regular exercise and eating healthy. Keep attending church.  Follow up in one month or as needed.

## 2017-08-01 ENCOUNTER — Ambulatory Visit
Admission: RE | Admit: 2017-08-01 | Discharge: 2017-08-01 | Disposition: A | Discharge status: Home / Self Care | Payer: 59 | Source: Ambulatory Visit | Attending: Obstetrics and Gynecology | Admitting: Obstetrics and Gynecology

## 2017-08-01 DIAGNOSIS — Z1231 Encounter for screening mammogram for malignant neoplasm of breast: Secondary | ICD-10-CM

## 2018-02-26 ENCOUNTER — Ambulatory Visit (INDEPENDENT_AMBULATORY_CARE_PROVIDER_SITE_OTHER): Payer: 59 | Admitting: Medical

## 2018-02-26 ENCOUNTER — Encounter: Payer: Self-pay | Admitting: Medical

## 2018-02-26 VITALS — BP 139/80 | HR 83 | Resp 18 | Ht 64.0 in | Wt 187.8 lb

## 2018-02-26 DIAGNOSIS — R062 Wheezing: Secondary | ICD-10-CM | POA: Diagnosis not present

## 2018-02-26 DIAGNOSIS — J40 Bronchitis, not specified as acute or chronic: Secondary | ICD-10-CM | POA: Diagnosis not present

## 2018-02-26 DIAGNOSIS — J309 Allergic rhinitis, unspecified: Secondary | ICD-10-CM | POA: Diagnosis not present

## 2018-02-26 DIAGNOSIS — J329 Chronic sinusitis, unspecified: Secondary | ICD-10-CM | POA: Diagnosis not present

## 2018-02-26 MED ORDER — AZITHROMYCIN 250 MG PO TABS: ORAL_TABLET | ORAL | 0 refills | Status: DC

## 2018-02-26 MED ORDER — BENZONATATE 100 MG PO CAPS: 100.0000 mg | ORAL_CAPSULE | Freq: Three times a day (TID) | ORAL | 0 refills | Status: DC | PRN

## 2018-02-26 MED ORDER — FLUTICASONE PROPIONATE 50 MCG/ACT NA SUSP: 2.0000 | Freq: Every day | NASAL | 1 refills | Status: DC

## 2018-02-26 MED ORDER — ALBUTEROL SULFATE HFA 108 (90 BASE) MCG/ACT IN AERS: 2.0000 | INHALATION_SPRAY | Freq: Four times a day (QID) | RESPIRATORY_TRACT | 2 refills | Status: DC | PRN

## 2018-02-26 MED ORDER — LEVOCETIRIZINE DIHYDROCHLORIDE 5 MG PO TABS: 5.0000 mg | ORAL_TABLET | Freq: Every evening | ORAL | 0 refills | Status: DC

## 2018-02-26 MED ORDER — LEVOCETIRIZINE DIHYDROCHLORIDE 5 MG PO TABS: 5.0000 mg | ORAL_TABLET | Freq: Every evening | ORAL | 3 refills | Status: DC

## 2018-02-26 NOTE — Patient Instructions (Addendum)
You appear to have bronchitis and sinusitis following one month of intermittent allergic rhinitis type signs/symptoms.. Rest hydrate and tylenol for fever. I am prescribing cough medicine benzonatate, and azithromycin  antibiotic.   For nasal congestion rx flonase spray.  Rx xyzal as well.  For wheezing rx abluterol inhaler.  You should gradually get better. If not then notify us and would recommend a chest xray.  Follow up in 7-10 days or as needed

## 2018-02-26 NOTE — Progress Notes (Signed)
Subjective:    Patient ID: [name], female    DOB: 11-Apr-1966, 52 y.o.   MRN: [id number]  HPI  Pt in with one month of nasal congestion and cough. At times cough dry other times cough is productive. Some faint chest congestion at times. 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 No diffuse body aches. No fever or chills.   Has some wheezing intermittently since ill. Pt states no hx of inhaler use. Pt does not smoke.  Pt had hysterectomy in past. Faint nausea since with the above.    Review of Systems  Constitutional: Negative for chills, fatigue and fever.  HENT: Positive for sinus pressure, sinus pain and sneezing.   Respiratory: Positive for cough and wheezing. Negative for shortness of breath.   Cardiovascular: Negative for chest pain and palpitations.  Gastrointestinal: Negative for abdominal distention, abdominal pain and diarrhea.  Musculoskeletal: Negative for back pain and joint swelling.  Skin: Negative for rash.  Neurological: Negative for dizziness, speech difficulty, weakness and light-headedness.  Hematological: Negative for adenopathy. Does not bruise/bleed easily.  Psychiatric/Behavioral: Negative for behavioral problems, decreased concentration and sleep disturbance. The patient is not nervous/anxious.      Past Medical History:  Diagnosis Date  . Fibroids    patient denies fibroids  . Fibroids    patient denies fibroids  . Hay fever    Seasonal Allergies  . PONV (postoperative nausea and vomiting)      Social History   Socioeconomic History  . Marital status: Married    Spouse name: Not on file  . Number of children: Not on file  . Years of education: Not on file  . Highest education level: Not on file  Social Needs  . Financial resource strain: Not on file  . Food insecurity - worry: Not on file  . Food insecurity - inability: Not on file  . Transportation needs - medical: Not on file  .  Transportation needs - non-medical: Not on file  Occupational History  . Not on file  Tobacco Use  . Smoking status: Never Smoker  . Smokeless tobacco: Never Used  Substance and Sexual Activity  . Alcohol use: No  . Drug use: No  . Sexual activity: Yes    Birth control/protection: Surgical  Other Topics Concern  . Not on file  Social History Narrative  . Not on file    Past Surgical History:  Procedure Laterality Date  . nova sure    . ROBOTIC ASSISTED TOTAL HYSTERECTOMY WITH SALPINGECTOMY Bilateral 01/12/2016   Procedure: ROBOTIC ASSISTED TOTAL HYSTERECTOMY WITH BILATERAL SALPINGECTOMY;  Surgeon: Servando Salina, MD;  Location: [facility];  Service: Gynecology;  Laterality: Bilateral;  3 hrs.  . TUBAL LIGATION    . WISDOM TOOTH EXTRACTION      Family History  Problem Relation Age of Onset  . Hypertension Mother   . Angina Mother   . Fibromyalgia Mother   . Asthma Mother   . Depression Mother   . Diabetes Father   . Depression Brother   . Post-traumatic stress disorder Brother   . Breast cancer Maternal Aunt        Great Aunt, Maternal  . Allergies Daughter     Allergies  Allergen Reactions  . Codeine Other (See Comments)    Dizziness     Current Outpatient Medications on File Prior to Visit  Medication Sig Dispense Refill  . azelastine (ASTELIN) 0.1 % nasal spray Place 2  sprays into both nostrils 2 (two) times daily. Use in each nostril as directed 30 mL 3  . Flaxseed, Linseed, (FLAX SEEDS PO) Take 1 tablet by mouth daily.    . Multiple Vitamins-Minerals (MULTIVITAMIN PO) Take by mouth. Women's One-A-Day     No current facility-administered medications on file prior to visit.     BP (!) 146/75 (BP Location: Left Arm, Patient Position: Sitting, Cuff Size: Large)   Pulse 83   Resp 18   Ht 5\' 4"  (1.626 m)   Wt 187 lb 12.8 oz (85.2 kg)   LMP 12/24/2015 (Exact Date) Comment: January 2017  SpO2 100%   BMI 32.24 kg/m       Objective:   Physical  Exam  General  Mental Status - Alert. General Appearance - Well groomed. Not in acute distress.  Skin Rashes- No Rashes.  HEENT Head- Normal. Ear Auditory Canal - Left- Normal. Right - Normal.Tympanic Membrane- Left- Normal. Right- Normal. Eye Sclera/Conjunctiva- Left- Normal. Right- Normal. Nose & Sinuses Nasal Mucosa- Left-  Boggy and Congested. Right-  Boggy and  Congested.Bilateral faint maxillary and  Faint frontal sinus pressure. Mouth & Throat Lips: Upper Lip- Normal: no dryness, cracking, pallor, cyanosis, or vesicular eruption. Lower Lip-Normal: no dryness, cracking, pallor, cyanosis or vesicular eruption. Buccal Mucosa- Bilateral- No Aphthous ulcers. Oropharynx- No Discharge or Erythema. Tonsils: Characteristics- Bilateral- No Erythema or Congestion. Size/Enlargement- Bilateral- No enlargement. Discharge- bilateral-None.  Neck Neck- Supple. No Masses.   Chest and Lung Exam Auscultation: Breath Sounds:-Clear even and unlabored.  Cardiovascular Auscultation:Rythm- Regular, rate and rhythm. Murmurs & Other Heart Sounds:Ausculatation of the heart reveal- No Murmurs.  Lymphatic Head & Neck General Head & Neck Lymphatics: Bilateral: Description- No Localized lymphadenopathy.       Assessment & Plan:  You appear to have bronchitis and sinusitis following one month of intermittent allergic rhinitis type signs/symptoms.. Rest hydrate and tylenol for fever. I am prescribing cough medicine benzonatate, and azithromycin  antibiotic.   For nasal congestion rx flonase spray.  Rx xyzal as well.  For wheezing rx abluterol inhaler.  You should gradually get better. If not then notify us and would recommend a chest xray.  Follow up in 7-10 days or as needed  [employer], [employer]

## 2018-05-02 ENCOUNTER — Telehealth: Payer: Self-pay | Admitting: Medical

## 2018-05-02 ENCOUNTER — Encounter: Payer: Self-pay | Admitting: Medical

## 2018-05-02 ENCOUNTER — Ambulatory Visit (INDEPENDENT_AMBULATORY_CARE_PROVIDER_SITE_OTHER): Payer: 59 | Admitting: Medical

## 2018-05-02 VITALS — BP 137/81 | HR 79 | Temp 98.1°F | Resp 16 | Ht 64.0 in | Wt 186.0 lb

## 2018-05-02 DIAGNOSIS — E010 Iodine-deficiency related diffuse (endemic) goiter: Secondary | ICD-10-CM | POA: Diagnosis not present

## 2018-05-02 DIAGNOSIS — Z0000 Encounter for general adult medical examination without abnormal findings: Secondary | ICD-10-CM | POA: Diagnosis not present

## 2018-05-02 DIAGNOSIS — R5383 Other fatigue: Secondary | ICD-10-CM

## 2018-05-02 LAB — CBC WITH DIFFERENTIAL/PLATELET
BASOS ABS: 0 10*3/uL (ref 0.0–0.1)
BASOS PCT: 1.1 % (ref 0.0–3.0)
EOS PCT: 2.3 % (ref 0.0–5.0)
Eosinophils Absolute: 0.1 10*3/uL (ref 0.0–0.7)
HEMATOCRIT: 40.2 % (ref 36.0–46.0)
Hemoglobin: 13.3 g/dL (ref 12.0–15.0)
LYMPHS PCT: 38.7 % (ref 12.0–46.0)
Lymphs Abs: 1.6 10*3/uL (ref 0.7–4.0)
MCHC: 33.2 g/dL (ref 30.0–36.0)
MCV: 84.8 fl (ref 78.0–100.0)
MONOS PCT: 7.3 % (ref 3.0–12.0)
Monocytes Absolute: 0.3 10*3/uL (ref 0.1–1.0)
NEUTROS ABS: 2.1 10*3/uL (ref 1.4–7.7)
Neutrophils Relative %: 50.6 % (ref 43.0–77.0)
Platelets: 237 10*3/uL (ref 150.0–400.0)
RBC: 4.75 Mil/uL (ref 3.87–5.11)
RDW: 14.3 % (ref 11.5–15.5)
WBC: 4.2 10*3/uL (ref 4.0–10.5)

## 2018-05-02 LAB — LIPID PANEL
CHOL/HDL RATIO: 3
Cholesterol: 164 mg/dL (ref 0–200)
HDL: 58.3 mg/dL (ref >39.00)
LDL Cholesterol: 91 mg/dL (ref 0–99)
NonHDL: 105.34
TRIGLYCERIDES: 71 mg/dL (ref 0.0–149.0)
VLDL: 14.2 mg/dL (ref 0.0–40.0)

## 2018-05-02 LAB — URINALYSIS, ROUTINE W REFLEX MICROSCOPIC
Bilirubin Urine: NEGATIVE
Hgb urine dipstick: NEGATIVE
Ketones, ur: NEGATIVE
Leukocytes, UA: NEGATIVE
NITRITE: NEGATIVE
PH: 6 (ref 5.0–8.0)
Specific Gravity, Urine: 1.025 (ref 1.000–1.030)
Total Protein, Urine: NEGATIVE
Urine Glucose: NEGATIVE
Urobilinogen, UA: 0.2 (ref 0.0–1.0)

## 2018-05-02 LAB — COMPREHENSIVE METABOLIC PANEL
ALT: 20 U/L (ref 0–35)
AST: 14 U/L (ref 0–37)
Albumin: 4.1 g/dL (ref 3.5–5.2)
Alkaline Phosphatase: 44 U/L (ref 39–117)
BILIRUBIN TOTAL: 0.4 mg/dL (ref 0.2–1.2)
BUN: 15 mg/dL (ref 6–23)
CALCIUM: 9.3 mg/dL (ref 8.4–10.5)
CO2: 29 meq/L (ref 19–32)
Chloride: 106 mEq/L (ref 96–112)
Creatinine, Ser: 0.91 mg/dL (ref 0.40–1.20)
GFR: 84.15 mL/min (ref >60.00)
Glucose, Bld: 92 mg/dL (ref 70–99)
Potassium: 4.8 mEq/L (ref 3.5–5.1)
Sodium: 140 mEq/L (ref 135–145)
Total Protein: 7.2 g/dL (ref 6.0–8.3)

## 2018-05-02 LAB — TSH: TSH: 0.86 u[IU]/mL (ref 0.35–4.50)

## 2018-05-02 LAB — T4, FREE: FREE T4: 0.73 ng/dL (ref 0.60–1.60)

## 2018-05-02 MED ORDER — LOSARTAN POTASSIUM 50 MG PO TABS: 50.0000 mg | ORAL_TABLET | Freq: Every day | ORAL | 1 refills | Status: DC

## 2018-05-02 NOTE — Telephone Encounter (Signed)
I called patient's pharmacy and canceled the losartan prescription actually sent on her chart.

## 2018-05-02 NOTE — Patient Instructions (Addendum)
For you wellness exam today I have ordered cbc, cmp, , lipid panel, and ua.  Vaccine given today tdap  Recommend exercise and healthy diet.  We will let you know lab results as they come in.  Follow up date appointment will be determined after lab review.   For fatigue and thyromegaly got tsh, t4, as well US soft tissue neck US. Pt can go down now to see if can get scheduled for [place].     Preventive Care 40-64 Years, Female Preventive care refers to lifestyle choices and visits with your health care provider that can promote health and wellness. What does preventive care include?  A yearly physical exam. This is also called an annual well check.  Dental exams once or twice a year.  Routine eye exams. Ask your health care provider how often you should have your eyes checked.  Personal lifestyle choices, including: ? Daily care of your teeth and gums. ? Regular physical activity. ? Eating a healthy diet. ? Avoiding tobacco and drug use. ? Limiting alcohol use. ? Practicing safe sex. ? Taking low-dose aspirin daily starting at age 76. ? Taking vitamin and mineral supplements as recommended by your health care provider. What happens during an annual well check? The services and screenings done by your health care provider during your annual well check will depend on your age, overall health, lifestyle risk factors, and family history of disease. Counseling Your health care provider may ask you questions about your:  Alcohol use.  Tobacco use.  Drug use.  Emotional well-being.  Home and relationship well-being.  Sexual activity.  Eating habits.  Work and work Statistician.  Method of birth control.  Menstrual cycle.  Pregnancy history.  Screening You may have the following tests or measurements:  Height, weight, and BMI.  Blood pressure.  Lipid and cholesterol levels. These may be checked every 5 years, or more frequently if you are over 19 years  old.  Skin check.  Lung cancer screening. You may have this screening every year starting at age 31 if you have a 30-pack-year history of smoking and currently smoke or have quit within the past 15 years.  Fecal occult blood test (FOBT) of the stool. You may have this test every year starting at age 43.  Flexible sigmoidoscopy or colonoscopy. You may have a sigmoidoscopy every 5 years or a colonoscopy every 10 years starting at age 27.  Hepatitis C blood test.  Hepatitis B blood test.  Sexually transmitted disease (STD) testing.  Diabetes screening. This is done by checking your blood sugar (glucose) after you have not eaten for a while (fasting). You may have this done every 1-3 years.  Mammogram. This may be done every 1-2 years. Talk to your health care provider about when you should start having regular mammograms. This may depend on whether you have a family history of breast cancer.  BRCA-related cancer screening. This may be done if you have a family history of breast, ovarian, tubal, or peritoneal cancers.  Pelvic exam and Pap test. This may be done every 3 years starting at age 31. Starting at age 69, this may be done every 5 years if you have a Pap test in combination with an HPV test.  Bone density scan. This is done to screen for osteoporosis. You may have this scan if you are at high risk for osteoporosis.  Discuss your test results, treatment options, and if necessary, the need for more tests with your health care  provider. Vaccines Your health care provider may recommend certain vaccines, such as:  Influenza vaccine. This is recommended every year.  Tetanus, diphtheria, and acellular pertussis (Tdap, Td) vaccine. You may need a Td booster every 10 years.  Varicella vaccine. You may need this if you have not been vaccinated.  Zoster vaccine. You may need this after age 48.  Measles, mumps, and rubella (MMR) vaccine. You may need at least one dose of MMR if you were  born in 1957 or later. You may also need a second dose.  Pneumococcal 13-valent conjugate (PCV13) vaccine. You may need this if you have certain conditions and were not previously vaccinated.  Pneumococcal polysaccharide (PPSV23) vaccine. You may need one or two doses if you smoke cigarettes or if you have certain conditions.  Meningococcal vaccine. You may need this if you have certain conditions.  Hepatitis A vaccine. You may need this if you have certain conditions or if you travel or work in places where you may be exposed to hepatitis A.  Hepatitis B vaccine. You may need this if you have certain conditions or if you travel or work in places where you may be exposed to hepatitis B.  Haemophilus influenzae type b (Hib) vaccine. You may need this if you have certain conditions.  Talk to your health care provider about which screenings and vaccines you need and how often you need them. This information is not intended to replace advice given to you by your health care provider. Make sure you discuss any questions you have with your health care provider. Document Released: 12/30/2015 Document Revised: 08/22/2016 Document Reviewed: 10/04/2015 Elsevier Interactive Patient Education  [employer].

## 2018-05-02 NOTE — Progress Notes (Signed)
Subjective:    Patient ID: [name], female    DOB: 1966-10-06, 52 y.o.   MRN: [id number]  HPI   Pt is fasting today. No food since midnight.  Pt updates me that she is eating healthier and exercising.  Pt is due for t-dap. Will get today.  Pt states she got call from GI on colonoscopy. She is seeing if her insurance will cover at facility.  Up to date on pap and mammogram.   No acute problems.Only faint fatigue.   Review of Systems  Constitutional: Positive for fatigue. Negative for chills and fever.  HENT: Negative for congestion, ear discharge, ear pain, postnasal drip, sinus pressure, sinus pain, tinnitus and trouble swallowing.   Respiratory: Negative for cough, chest tightness, shortness of breath and wheezing.   Cardiovascular: Negative for chest pain and palpitations.  Gastrointestinal: Negative for abdominal distention, anal bleeding, constipation, diarrhea and vomiting.  Musculoskeletal: Negative for back pain.  Skin: Negative for rash.  Neurological: Negative for dizziness, speech difficulty, weakness and headaches.  Hematological: Negative for adenopathy. Does not bruise/bleed easily.  Psychiatric/Behavioral: Negative for behavioral problems, confusion and sleep disturbance. The patient is not nervous/anxious.     Past Medical History:  Diagnosis Date  . Fibroids    patient denies fibroids  . Fibroids    patient denies fibroids  . Hay fever    Seasonal Allergies  . PONV (postoperative nausea and vomiting)      Social History   Socioeconomic History  . Marital status: Married    Spouse name: Not on file  . Number of children: Not on file  . Years of education: Not on file  . Highest education level: Not on file  Occupational History  . Not on file  Social Needs  . Financial resource strain: Not on file  . Food insecurity:    Worry: Not on file    Inability: Not on file  . Transportation needs:    Medical: Not on file   Non-medical: Not on file  Tobacco Use  . Smoking status: Never Smoker  . Smokeless tobacco: Never Used  Substance and Sexual Activity  . Alcohol use: No  . Drug use: No  . Sexual activity: Yes    Birth control/protection: Surgical  Lifestyle  . Physical activity:    Days per week: Not on file    Minutes per session: Not on file  . Stress: Not on file  Relationships  . Social connections:    Talks on phone: Not on file    Gets together: Not on file    Attends religious service: Not on file    Active member of club or organization: Not on file    Attends meetings of clubs or organizations: Not on file    Relationship status: Not on file  . Intimate partner violence:    Fear of current or ex partner: Not on file    Emotionally abused: Not on file    Physically abused: Not on file    Forced sexual activity: Not on file  Other Topics Concern  . Not on file  Social History Narrative  . Not on file    Past Surgical History:  Procedure Laterality Date  . nova sure    . ROBOTIC ASSISTED TOTAL HYSTERECTOMY WITH SALPINGECTOMY Bilateral 01/12/2016   Procedure: ROBOTIC ASSISTED TOTAL HYSTERECTOMY WITH BILATERAL SALPINGECTOMY;  Surgeon: Servando Salina, MD;  Location: [facility];  Service: Gynecology;  Laterality: Bilateral;  3 hrs.  . TUBAL LIGATION    .  WISDOM TOOTH EXTRACTION      Family History  Problem Relation Age of Onset  . Hypertension Mother   . Angina Mother   . Fibromyalgia Mother   . Asthma Mother   . Depression Mother   . Diabetes Father   . Depression Brother   . Post-traumatic stress disorder Brother   . Breast cancer Maternal Aunt        Great Aunt, Maternal  . Allergies Daughter     Allergies  Allergen Reactions  . Codeine Other (See Comments)    Dizziness     Current Outpatient Medications on File Prior to Visit  Medication Sig Dispense Refill  . Flaxseed, Linseed, (FLAX SEEDS PO) Take 1 tablet by mouth daily.    . Multiple Vitamins-Minerals  (MULTIVITAMIN PO) Take by mouth. Women's One-A-Day     No current facility-administered medications on file prior to visit.     BP 137/81   Pulse 79   Temp 98.1 F (36.7 C) (Oral)   Resp 16   Ht 5\' 4"  (1.626 m)   Wt 186 lb (84.4 kg)   LMP 12/24/2015 (Exact Date) Comment: January 2017  SpO2 99%   BMI 31.93 kg/m       Objective:   Physical Exam  General Mental Status- Alert. General Appearance- Not in acute distress.   Skin General: Color- Normal Color. Moisture- Normal Moisture.  Neck Carotid Arteries- Normal color. Moisture- Normal Moisture. No carotid bruits. No JVD. Thyromegaly on exam.Moderat large and symmetric.No nodules felt.  Chest and Lung Exam Auscultation: Breath Sounds:-Normal.  Cardiovascular Auscultation:Rythm- Regular. Murmurs & Other Heart Sounds:Auscultation of the heart reveals- No Murmurs.  Abdomen Inspection:-Inspeection Normal. Palpation/Percussion:Note:No mass. Palpation and Percussion of the abdomen reveal- Non Tender, Non Distended + BS, no rebound or guarding.   Neurologic Cranial Nerve exam:- CN III-XII intact(No nystagmus), symmetric smile. Strength:- 5/5 equal and symmetric strength both upper and lower extremities.      Assessment & Plan:  For you wellness exam today I have ordered cbc, cmp, , lipid panel, and ua.  Vaccine given today tdap  Recommend exercise and healthy diet.  We will let you know lab results as they come in.  Follow up date appointment will be determined after lab review.  Mackie Pai, PA-C

## 2018-05-05 ENCOUNTER — Ambulatory Visit (HOSPITAL_BASED_OUTPATIENT_CLINIC_OR_DEPARTMENT_OTHER)
Admission: RE | Admit: 2018-05-05 | Discharge: 2018-05-05 | Disposition: A | Discharge status: Home / Self Care | Payer: 59 | Source: Ambulatory Visit | Attending: Medical | Admitting: Medical

## 2018-05-05 DIAGNOSIS — E041 Nontoxic single thyroid nodule: Secondary | ICD-10-CM | POA: Insufficient documentation

## 2018-05-05 DIAGNOSIS — R9389 Abnormal findings on diagnostic imaging of other specified body structures: Secondary | ICD-10-CM | POA: Diagnosis not present

## 2018-05-05 DIAGNOSIS — E010 Iodine-deficiency related diffuse (endemic) goiter: Secondary | ICD-10-CM

## 2018-05-11 ENCOUNTER — Encounter: Payer: Self-pay | Admitting: Medical

## 2018-05-12 ENCOUNTER — Telehealth: Payer: Self-pay | Admitting: Medical

## 2018-05-12 DIAGNOSIS — E041 Nontoxic single thyroid nodule: Secondary | ICD-10-CM

## 2018-05-12 NOTE — Telephone Encounter (Signed)
Referral to ENT placed

## 2018-05-13 ENCOUNTER — Other Ambulatory Visit: Payer: Self-pay

## 2018-05-13 ENCOUNTER — Encounter: Payer: Self-pay | Admitting: Medical

## 2018-05-13 DIAGNOSIS — R8271 Bacteriuria: Secondary | ICD-10-CM

## 2018-05-14 ENCOUNTER — Other Ambulatory Visit: Payer: 59

## 2018-05-14 DIAGNOSIS — R8271 Bacteriuria: Secondary | ICD-10-CM

## 2018-05-15 LAB — URINE CULTURE
MICRO NUMBER:: 90646372
RESULT: NO GROWTH
SPECIMEN QUALITY:: ADEQUATE

## 2018-07-18 ENCOUNTER — Other Ambulatory Visit: Payer: Self-pay | Admitting: Obstetrics and Gynecology

## 2018-07-18 DIAGNOSIS — Z1231 Encounter for screening mammogram for malignant neoplasm of breast: Secondary | ICD-10-CM

## 2018-08-14 ENCOUNTER — Ambulatory Visit
Admission: RE | Admit: 2018-08-14 | Discharge: 2018-08-14 | Disposition: A | Discharge status: Home / Self Care | Payer: 59 | Source: Ambulatory Visit | Attending: Obstetrics and Gynecology | Admitting: Obstetrics and Gynecology

## 2018-08-14 DIAGNOSIS — Z1231 Encounter for screening mammogram for malignant neoplasm of breast: Secondary | ICD-10-CM

## 2019-07-23 ENCOUNTER — Other Ambulatory Visit: Payer: Self-pay | Admitting: Obstetrics and Gynecology

## 2019-07-23 DIAGNOSIS — Z1231 Encounter for screening mammogram for malignant neoplasm of breast: Secondary | ICD-10-CM

## 2019-08-03 ENCOUNTER — Other Ambulatory Visit: Payer: Self-pay

## 2019-08-03 ENCOUNTER — Encounter: Payer: Self-pay | Admitting: Medical

## 2019-08-04 ENCOUNTER — Ambulatory Visit (INDEPENDENT_AMBULATORY_CARE_PROVIDER_SITE_OTHER): Payer: 59 | Admitting: Medical

## 2019-08-04 VITALS — BP 127/79 | HR 88 | Temp 96.8°F | Resp 16 | Ht 64.0 in | Wt 190.8 lb

## 2019-08-04 DIAGNOSIS — L509 Urticaria, unspecified: Secondary | ICD-10-CM | POA: Diagnosis not present

## 2019-08-04 DIAGNOSIS — T7840XA Allergy, unspecified, initial encounter: Secondary | ICD-10-CM

## 2019-08-04 MED ORDER — PREDNISONE 10 MG PO TABS: ORAL_TABLET | ORAL | 0 refills | Status: DC

## 2019-08-04 MED ORDER — HYDROXYZINE HCL 10 MG PO TABS
10.0000 mg | ORAL_TABLET | Freq: Three times a day (TID) | ORAL | 0 refills | Status: DC | PRN
Start: 2019-08-04 — End: 2019-09-04

## 2019-08-04 NOTE — Patient Instructions (Signed)
You report onset of itching after application of Vaseline product 2 weeks ago.  The area is still itching on upper and lower extremities.  No obvious rash presently.  Will treat for probable allergic reaction with 4-day low-dose taper prednisone and make hydroxyzine available for itching.  You can make sure that you keep your skin well hydrated but not with similar/formal use of product.  Use something different.  We will see if you improve with above treatment.  Please update me in 7 days or as needed if you have worsening or changing symptoms.

## 2019-08-04 NOTE — Progress Notes (Signed)
Subjective:    Patient ID: [name], female    DOB: 01-19-1966, 53 y.o.   MRN: [id number]  HPI  Pt in for evaluation.  Pt states last 2 weeks she has been itching on her legs and her arms. Pt states not sure but she states itching came on at time of her vacation. Pt states she used some vaseline based oil product. She states that day her skin felt warm and itchy. Still having itching as well. No other family member has skin rash or itching.  Pt works at [employer]. She gets tested for covid routinely and studies come back negative.  Pt does not a lot of marital stress and she thinks maybe causing the itching.  No scabies distribution per pt report and no pt has dx skin condition that she knows of.    Review of Systems  Constitutional: Negative for chills, fatigue and fever.  HENT: Negative for congestion and ear discharge.   Eyes: Negative for photophobia.  Respiratory: Negative for cough, chest tightness and wheezing.   Cardiovascular: Negative for chest pain and palpitations.  Gastrointestinal: Negative for abdominal pain.  Genitourinary: Negative for difficulty urinating, dysuria, urgency and vaginal discharge.  Musculoskeletal: Negative for back pain, myalgias and neck stiffness.  Skin: Negative for rash.       Itching.  Neurological: Negative for dizziness, speech difficulty, weakness, numbness and headaches.  Hematological: Negative for adenopathy. Does not bruise/bleed easily.  Psychiatric/Behavioral: Negative for agitation, behavioral problems, decreased concentration and suicidal ideas. The patient is not nervous/anxious.     Past Medical History:  Diagnosis Date  . Fibroids    patient denies fibroids  . Fibroids    patient denies fibroids  . Hay fever    Seasonal Allergies  . PONV (postoperative nausea and vomiting)      Social History   Socioeconomic History  . Marital status: Married    Spouse name: Not on file  . Number of children: Not  on file  . Years of education: Not on file  . Highest education level: Not on file  Occupational History  . Not on file  Social Needs  . Financial resource strain: Not on file  . Food insecurity    Worry: Not on file    Inability: Not on file  . Transportation needs    Medical: Not on file    Non-medical: Not on file  Tobacco Use  . Smoking status: Never Smoker  . Smokeless tobacco: Never Used  Substance and Sexual Activity  . Alcohol use: No  . Drug use: No  . Sexual activity: Yes    Birth control/protection: Surgical  Lifestyle  . Physical activity    Days per week: Not on file    Minutes per session: Not on file  . Stress: Not on file  Relationships  . Social Herbalist on phone: Not on file    Gets together: Not on file    Attends religious service: Not on file    Active member of club or organization: Not on file    Attends meetings of clubs or organizations: Not on file    Relationship status: Not on file  . Intimate partner violence    Fear of current or ex partner: Not on file    Emotionally abused: Not on file    Physically abused: Not on file    Forced sexual activity: Not on file  Other Topics Concern  . Not on file  Social History Narrative  . Not on file    Past Surgical History:  Procedure Laterality Date  . nova sure    . ROBOTIC ASSISTED TOTAL HYSTERECTOMY WITH SALPINGECTOMY Bilateral 01/12/2016   Procedure: ROBOTIC ASSISTED TOTAL HYSTERECTOMY WITH BILATERAL SALPINGECTOMY;  Surgeon: Servando Salina, MD;  Location: [facility];  Service: Gynecology;  Laterality: Bilateral;  3 hrs.  . TUBAL LIGATION    . WISDOM TOOTH EXTRACTION      Family History  Problem Relation Age of Onset  . Hypertension Mother   . Angina Mother   . Fibromyalgia Mother   . Asthma Mother   . Depression Mother   . Diabetes Father   . Depression Brother   . Post-traumatic stress disorder Brother   . Breast cancer Maternal Aunt        Great Aunt, Maternal  .  Allergies Daughter     Allergies  Allergen Reactions  . Codeine Other (See Comments)    Dizziness     Current Outpatient Medications on File Prior to Visit  Medication Sig Dispense Refill  . Flaxseed, Linseed, (FLAX SEEDS PO) Take 1 tablet by mouth daily.    . Multiple Vitamins-Minerals (MULTIVITAMIN PO) Take by mouth. Women's One-A-Day     No current facility-administered medications on file prior to visit.     LMP 12/24/2015 (Exact Date) Comment: January 2017      Objective:   Physical Exam  General Mental Status- Alert. General Appearance- Not in acute distress.   Skin No obvious rash on forearm or lower ext where area is itching.  Neck Carotid Arteries- Normal color. Moisture- Normal Moisture. No carotid bruits. No JVD.  Chest and Lung Exam Auscultation: Breath Sounds:-Normal.  Cardiovascular Auscultation:Rythm- Regular. Murmurs & Other Heart Sounds:Auscultation of the heart reveals- No Murmurs.   Neurologic Cranial Nerve exam:- CN III-XII intact(No nystagmus), symmetric smile. Strength:- 5/5 equal and symmetric strength both upper and lower extremities.      Assessment & Plan:  You report onset of itching after application of Vaseline product 2 weeks ago.  The area is still itching on upper and lower extremities.  No obvious rash presently.  Will treat for probable allergic reaction with 4-day low-dose taper prednisone and make hydroxyzine available for itching.  You can make sure that you keep your skin well hydrated but not with similar/formal use of product.  Use something different.  We will see if you improve with above treatment.  Please update me in 7 days or as needed if you have worsening or changing symptoms.  Mackie Pai, PA-C

## 2019-08-06 ENCOUNTER — Ambulatory Visit: Payer: 59 | Admitting: Medical

## 2019-09-01 ENCOUNTER — Encounter: Payer: Self-pay | Admitting: Medical

## 2019-09-03 ENCOUNTER — Other Ambulatory Visit: Payer: Self-pay

## 2019-09-04 ENCOUNTER — Other Ambulatory Visit: Payer: Self-pay

## 2019-09-04 ENCOUNTER — Ambulatory Visit (INDEPENDENT_AMBULATORY_CARE_PROVIDER_SITE_OTHER): Payer: 59 | Admitting: Medical

## 2019-09-04 ENCOUNTER — Encounter: Payer: Self-pay | Admitting: Medical

## 2019-09-04 VITALS — BP 129/74 | HR 78 | Temp 96.6°F | Resp 16 | Ht 64.0 in | Wt 194.2 lb

## 2019-09-04 DIAGNOSIS — R5383 Other fatigue: Secondary | ICD-10-CM | POA: Diagnosis not present

## 2019-09-04 DIAGNOSIS — R232 Flushing: Secondary | ICD-10-CM

## 2019-09-04 DIAGNOSIS — Z833 Family history of diabetes mellitus: Secondary | ICD-10-CM | POA: Diagnosis not present

## 2019-09-04 DIAGNOSIS — Z23 Encounter for immunization: Secondary | ICD-10-CM | POA: Diagnosis not present

## 2019-09-04 DIAGNOSIS — Z0000 Encounter for general adult medical examination without abnormal findings: Secondary | ICD-10-CM | POA: Diagnosis not present

## 2019-09-04 LAB — LIPID PANEL
Cholesterol: 169 mg/dL (ref 0–200)
HDL: 55.1 mg/dL (ref >39.00)
LDL Cholesterol: 103 mg/dL — ABNORMAL HIGH (ref 0–99)
NonHDL: 113.56
Total CHOL/HDL Ratio: 3
Triglycerides: 55 mg/dL (ref 0.0–149.0)
VLDL: 11 mg/dL (ref 0.0–40.0)

## 2019-09-04 LAB — CBC WITH DIFFERENTIAL/PLATELET
Basophils Absolute: 0 10*3/uL (ref 0.0–0.1)
Basophils Relative: 0.3 % (ref 0.0–3.0)
Eosinophils Absolute: 0.1 10*3/uL (ref 0.0–0.7)
Eosinophils Relative: 2.8 % (ref 0.0–5.0)
HCT: 36.8 % (ref 36.0–46.0)
Hemoglobin: 12 g/dL (ref 12.0–15.0)
Lymphocytes Relative: 32.7 % (ref 12.0–46.0)
Lymphs Abs: 1.5 10*3/uL (ref 0.7–4.0)
MCHC: 32.7 g/dL (ref 30.0–36.0)
MCV: 84.3 fl (ref 78.0–100.0)
Monocytes Absolute: 0.3 10*3/uL (ref 0.1–1.0)
Monocytes Relative: 6.8 % (ref 3.0–12.0)
Neutro Abs: 2.7 10*3/uL (ref 1.4–7.7)
Neutrophils Relative %: 57.4 % (ref 43.0–77.0)
Platelets: 271 10*3/uL (ref 150.0–400.0)
RBC: 4.37 Mil/uL (ref 3.87–5.11)
RDW: 14.4 % (ref 11.5–15.5)
WBC: 4.6 10*3/uL (ref 4.0–10.5)

## 2019-09-04 LAB — FOLLICLE STIMULATING HORMONE: FSH: 72 m[IU]/mL

## 2019-09-04 LAB — COMPREHENSIVE METABOLIC PANEL
ALT: 25 U/L (ref 0–35)
AST: 15 U/L (ref 0–37)
Albumin: 4.2 g/dL (ref 3.5–5.2)
Alkaline Phosphatase: 57 U/L (ref 39–117)
BUN: 13 mg/dL (ref 6–23)
CO2: 28 mEq/L (ref 19–32)
Calcium: 9.7 mg/dL (ref 8.4–10.5)
Chloride: 106 mEq/L (ref 96–112)
Creatinine, Ser: 0.86 mg/dL (ref 0.40–1.20)
GFR: 84.05 mL/min (ref >60.00)
Glucose, Bld: 90 mg/dL (ref 70–99)
Potassium: 4.2 mEq/L (ref 3.5–5.1)
Sodium: 140 mEq/L (ref 135–145)
Total Bilirubin: 0.4 mg/dL (ref 0.2–1.2)
Total Protein: 7.1 g/dL (ref 6.0–8.3)

## 2019-09-04 LAB — VITAMIN B12: Vitamin B-12: 851 pg/mL (ref 211–911)

## 2019-09-04 LAB — HEMOGLOBIN A1C: Hgb A1c MFr Bld: 6.1 % (ref 4.6–6.5)

## 2019-09-04 NOTE — Patient Instructions (Addendum)
For you wellness exam today I have ordered cbc, cmp, b12, b1, vit d, fsh and  lipid panel.  Flu vaccine today.   Recommend exercise and healthy diet.  We will let you know lab results as they come in.  Follow up date appointment will be determined after lab review.   Will follow fsh. If menopause can use ambren otc or black cohash.   Preventive Care 88-51 Years Old, Female Preventive care refers to visits with your health care provider and lifestyle choices that can promote health and wellness. This includes:  A yearly physical exam. This may also be called an annual well check.  Regular dental visits and eye exams.  Immunizations.  Screening for certain conditions.  Healthy lifestyle choices, such as eating a healthy diet, getting regular exercise, not using drugs or products that contain nicotine and tobacco, and limiting alcohol use. What can I expect for my preventive care visit? Physical exam Your health care provider will check your:  Height and weight. This may be used to calculate body mass index (BMI), which tells if you are at a healthy weight.  Heart rate and blood pressure.  Skin for abnormal spots. Counseling Your health care provider may ask you questions about your:  Alcohol, tobacco, and drug use.  Emotional well-being.  Home and relationship well-being.  Sexual activity.  Eating habits.  Work and work Statistician.  Method of birth control.  Menstrual cycle.  Pregnancy history. What immunizations do I need?  Influenza (flu) vaccine  This is recommended every year. Tetanus, diphtheria, and pertussis (Tdap) vaccine  You may need a Td booster every 10 years. Varicella (chickenpox) vaccine  You may need this if you have not been vaccinated. Zoster (shingles) vaccine  You may need this after age 27. Measles, mumps, and rubella (MMR) vaccine  You may need at least one dose of MMR if you were born in 1957 or later. You may also need a  second dose. Pneumococcal conjugate (PCV13) vaccine  You may need this if you have certain conditions and were not previously vaccinated. Pneumococcal polysaccharide (PPSV23) vaccine  You may need one or two doses if you smoke cigarettes or if you have certain conditions. Meningococcal conjugate (MenACWY) vaccine  You may need this if you have certain conditions. Hepatitis A vaccine  You may need this if you have certain conditions or if you travel or work in places where you may be exposed to hepatitis A. Hepatitis B vaccine  You may need this if you have certain conditions or if you travel or work in places where you may be exposed to hepatitis B. Haemophilus influenzae type b (Hib) vaccine  You may need this if you have certain conditions. Human papillomavirus (HPV) vaccine  If recommended by your health care provider, you may need three doses over 6 months. You may receive vaccines as individual doses or as more than one vaccine together in one shot (combination vaccines). Talk with your health care provider about the risks and benefits of combination vaccines. What tests do I need? Blood tests  Lipid and cholesterol levels. These may be checked every 5 years, or more frequently if you are over 87 years old.  Hepatitis C test.  Hepatitis B test. Screening  Lung cancer screening. You may have this screening every year starting at age 46 if you have a 30-pack-year history of smoking and currently smoke or have quit within the past 15 years.  Colorectal cancer screening. All adults should have  this screening starting at age 65 and continuing until age 84. Your health care provider may recommend screening at age 24 if you are at increased risk. You will have tests every 1-10 years, depending on your results and the type of screening test.  Diabetes screening. This is done by checking your blood sugar (glucose) after you have not eaten for a while (fasting). You may have this done  every 1-3 years.  Mammogram. This may be done every 1-2 years. Talk with your health care provider about when you should start having regular mammograms. This may depend on whether you have a family history of breast cancer.  BRCA-related cancer screening. This may be done if you have a family history of breast, ovarian, tubal, or peritoneal cancers.  Pelvic exam and Pap test. This may be done every 3 years starting at age 51. Starting at age 62, this may be done every 5 years if you have a Pap test in combination with an HPV test. Other tests  Sexually transmitted disease (STD) testing.  Bone density scan. This is done to screen for osteoporosis. You may have this scan if you are at high risk for osteoporosis. Follow these instructions at home: Eating and drinking  Eat a diet that includes fresh fruits and vegetables, whole grains, lean protein, and low-fat dairy.  Take vitamin and mineral supplements as recommended by your health care provider.  Do not drink alcohol if: ? Your health care provider tells you not to drink. ? You are pregnant, may be pregnant, or are planning to become pregnant.  If you drink alcohol: ? Limit how much you have to 0-1 drink a day. ? Be aware of how much alcohol is in your drink. In the [place] one drink equals one 12 oz bottle of beer (355 mL), one 5 oz glass of wine (148 mL), or one 1 oz glass of hard liquor (44 mL). Lifestyle  Take daily care of your teeth and gums.  Stay active. Exercise for at least 30 minutes on 5 or more days each week.  Do not use any products that contain nicotine or tobacco, such as cigarettes, e-cigarettes, and chewing tobacco. If you need help quitting, ask your health care provider.  If you are sexually active, practice safe sex. Use a condom or other form of birth control (contraception) in order to prevent pregnancy and STIs (sexually transmitted infections).  If told by your health care provider, take low-dose aspirin  daily starting at age 34. What's next?  Visit your health care provider once a year for a well check visit.  Ask your health care provider how often you should have your eyes and teeth checked.  Stay up to date on all vaccines. This information is not intended to replace advice given to you by your health care provider. Make sure you discuss any questions you have with your health care provider. Document Released: 12/30/2015 Document Revised: 08/14/2018 Document Reviewed: 08/14/2018 Elsevier Patient Education  2020 [employer].

## 2019-09-04 NOTE — Addendum Note (Signed)
Addended by: Wynonia Musty A on: 09/04/2019 10:53 AM   Modules accepted: Orders

## 2019-09-04 NOTE — Progress Notes (Signed)
Subjective:    Patient ID: [name], female    DOB: 04/13/1966, 53 y.o.   MRN: [id number]  HPI  Patient here for annual physical. Only concern today is menopause symptoms. Reports hot flashes, weight gain, mood swings for the past year. She had occasional symptoms following her hysterectomy in 2017, but symptoms are more severe now.   Total hysterectomy in 2017, still has ovaries per patient. GYN still does paps - normal in 2019. Mammogram scheduled for next week.  Patient had colonoscopy last year, 5 year follow-up per patient - polyps removed.  Patient denies tobacco, alcohol and drug use.   She reports eating fairly healthy, has been craving sweets since menopausal symptoms began. Gained about 4 pounds over the past 2 weeks. 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 She has been walking 2-4 miles 3-4 days per week.  Would like flu vaccine today.   Review of Systems  Constitutional: Negative for chills, fatigue and fever.       Hot flashes, weight gain over the past year  HENT: Negative for congestion, sinus pain and sore throat.   Eyes: Negative for visual disturbance.  Respiratory: Negative for chest tightness and wheezing.        Occasional dyspnea with exertion  Cardiovascular: Negative for chest pain and palpitations.  Gastrointestinal: Negative for blood in stool, constipation, diarrhea and nausea.  Endocrine: Negative for polydipsia, polyphagia and polyuria.  Genitourinary: Negative for frequency, hematuria and urgency.  Musculoskeletal: Negative for arthralgias and myalgias.  Skin: Negative for color change and rash.  Neurological: Negative for dizziness and headaches.  Psychiatric/Behavioral: Negative for self-injury, sleep disturbance and suicidal ideas. The patient is not nervous/anxious.     Past Medical History:  Diagnosis Date  . Fibroids    patient denies fibroids  . Fibroids    patient denies fibroids  . Hay  fever    Seasonal Allergies  . PONV (postoperative nausea and vomiting)      Social History   Socioeconomic History  . Marital status: Married    Spouse name: Not on file  . Number of children: Not on file  . Years of education: Not on file  . Highest education level: Not on file  Occupational History  . Not on file  Social Needs  . Financial resource strain: Not on file  . Food insecurity    Worry: Not on file    Inability: Not on file  . Transportation needs    Medical: Not on file    Non-medical: Not on file  Tobacco Use  . Smoking status: Former Smoker    Types: Cigarettes    Quit date: 1992    Years since quitting: 28.7  . Smokeless tobacco: Never Used  Substance and Sexual Activity  . Alcohol use: No  . Drug use: No  . Sexual activity: Yes    Birth control/protection: Surgical  Lifestyle  . Physical activity    Days per week: Not on file    Minutes per session: Not on file  . Stress: Not on file  Relationships  . Social Herbalist on phone: Not on file    Gets together: Not on file    Attends religious service: Not on file    Active member of club or organization: Not on file    Attends meetings of clubs or organizations: Not on file    Relationship status: Not on file  . Intimate partner violence  Fear of current or ex partner: Not on file    Emotionally abused: Not on file    Physically abused: Not on file    Forced sexual activity: Not on file  Other Topics Concern  . Not on file  Social History Narrative  . Not on file    Past Surgical History:  Procedure Laterality Date  . nova sure    . ROBOTIC ASSISTED TOTAL HYSTERECTOMY WITH SALPINGECTOMY Bilateral 01/12/2016   Procedure: ROBOTIC ASSISTED TOTAL HYSTERECTOMY WITH BILATERAL SALPINGECTOMY;  Surgeon: Servando Salina, MD;  Location: [facility];  Service: Gynecology;  Laterality: Bilateral;  3 hrs.  . TUBAL LIGATION    . WISDOM TOOTH EXTRACTION      Family History  Problem Relation  Age of Onset  . Hypertension Mother   . Angina Mother   . Fibromyalgia Mother   . Asthma Mother   . Depression Mother   . Arthritis Mother   . Diabetes Father   . Depression Brother   . Post-traumatic stress disorder Brother   . Breast cancer Maternal Aunt        Great Aunt, Maternal  . Allergies Daughter     Allergies  Allergen Reactions  . Codeine Other (See Comments)    Dizziness     Current Outpatient Medications on File Prior to Visit  Medication Sig Dispense Refill  . Flaxseed, Linseed, (FLAX SEEDS PO) Take 1 tablet by mouth daily.    . Multiple Vitamins-Minerals (MULTIVITAMIN PO) Take by mouth. Women's One-A-Day     No current facility-administered medications on file prior to visit.     BP 129/74   Pulse 78   Temp (!) 96.6 F (35.9 C) (Temporal)   Resp 16   Ht 5\' 4"  (1.626 m)   Wt 194 lb 3.2 oz (88.1 kg)   LMP 12/24/2015 (Exact Date) Comment: January 2017  SpO2 100%   BMI 33.33 kg/m       Objective:   Physical Exam   General Mental Status- Alert. General Appearance- Not in acute distress.   Skin General: Color- Normal Color. Moisture- Normal Moisture.  Neck Carotid Arteries- Normal color. Moisture- Normal Moisture. No carotid bruits. No JVD.  Chest and Lung Exam Auscultation: Breath Sounds:-Normal.  Cardiovascular Auscultation:Rythm- Regular. Murmurs & Other Heart Sounds:Auscultation of the heart reveals- No Murmurs.  Abdomen Inspection:-Inspeection Normal. Palpation/Percussion:Note:No mass. Palpation and Percussion of the abdomen reveal- Non Tender, Non Distended + BS, no rebound or guarding.   Neurologic Cranial Nerve exam:- CN III-XII intact(No nystagmus), symmetric smile. Strength:- 5/5 equal and symmetric strength both upper and lower extremities.      Assessment & Plan:  For you wellness exam today I have ordered cbc, cmp, b12, b1, vit d, fsh and  lipid panel.  Flu vaccine today.   Recommend exercise and healthy diet.  We  will let you know lab results as they come in.  Follow up date appointment will be determined after lab review.   Will follow fsh. If menopause can use ambren otc or black cohash.  Mackie Pai, PA-C

## 2019-09-07 ENCOUNTER — Other Ambulatory Visit: Payer: Self-pay

## 2019-09-07 ENCOUNTER — Ambulatory Visit
Admission: RE | Admit: 2019-09-07 | Discharge: 2019-09-07 | Disposition: A | Discharge status: Home / Self Care | Payer: 59 | Source: Ambulatory Visit | Attending: Obstetrics and Gynecology | Admitting: Obstetrics and Gynecology

## 2019-09-07 DIAGNOSIS — Z1231 Encounter for screening mammogram for malignant neoplasm of breast: Secondary | ICD-10-CM

## 2019-09-10 LAB — VITAMIN D 1,25 DIHYDROXY
Vitamin D 1, 25 (OH)2 Total: 52 pg/mL (ref 18–72)
Vitamin D2 1, 25 (OH)2: <8 pg/mL
Vitamin D3 1, 25 (OH)2: 52 pg/mL

## 2019-09-10 LAB — VITAMIN B1: Vitamin B1 (Thiamine): 10 nmol/L (ref 8–30)

## 2019-10-02 ENCOUNTER — Other Ambulatory Visit: Payer: Self-pay

## 2019-10-02 DIAGNOSIS — Z20822 Contact with and (suspected) exposure to covid-19: Secondary | ICD-10-CM

## 2019-10-04 LAB — NOVEL CORONAVIRUS, NAA: SARS-CoV-2, NAA: NOT DETECTED

## 2019-11-08 ENCOUNTER — Encounter: Payer: Self-pay | Admitting: Medical

## 2019-11-09 ENCOUNTER — Telehealth: Payer: Self-pay | Admitting: Medical

## 2019-11-09 NOTE — Telephone Encounter (Signed)
I was diagnosed with Covid 10/29/19. I have been in quarantine since then and [facility] is sending me a release letter tomorrow 11/09/19 was just wondering if I need to come in to test before I go back to work or should I go to drive thru at hospital to because they send results to my chart as well.  Pt inquiring again.

## 2019-12-04 ENCOUNTER — Encounter: Payer: Self-pay | Admitting: Medical

## 2019-12-08 ENCOUNTER — Other Ambulatory Visit: Payer: Self-pay

## 2019-12-08 ENCOUNTER — Ambulatory Visit (INDEPENDENT_AMBULATORY_CARE_PROVIDER_SITE_OTHER): Payer: 59 | Admitting: Medical

## 2019-12-08 VITALS — BP 155/104 | HR 94 | Temp 97.7°F

## 2019-12-08 DIAGNOSIS — R05 Cough: Secondary | ICD-10-CM

## 2019-12-08 DIAGNOSIS — R059 Cough, unspecified: Secondary | ICD-10-CM

## 2019-12-08 DIAGNOSIS — J40 Bronchitis, not specified as acute or chronic: Secondary | ICD-10-CM | POA: Diagnosis not present

## 2019-12-08 MED ORDER — ALBUTEROL SULFATE HFA 108 (90 BASE) MCG/ACT IN AERS: 2.0000 | INHALATION_SPRAY | Freq: Four times a day (QID) | RESPIRATORY_TRACT | 0 refills | Status: DC | PRN

## 2019-12-08 MED ORDER — AZITHROMYCIN 250 MG PO TABS: ORAL_TABLET | ORAL | 0 refills | Status: DC

## 2019-12-08 MED ORDER — FLUTICASONE PROPIONATE 50 MCG/ACT NA SUSP: 2.0000 | Freq: Every day | NASAL | 1 refills | Status: DC

## 2019-12-08 MED ORDER — BENZONATATE 100 MG PO CAPS: 100.0000 mg | ORAL_CAPSULE | Freq: Three times a day (TID) | ORAL | 0 refills | Status: DC | PRN

## 2019-12-08 NOTE — Progress Notes (Signed)
Subjective:    Patient ID: [name], female    DOB: 25-Jan-1966, 53 y.o.   MRN: [id number]  HPI Virtual Visit via Telephone Note  I connected with [name] on 12/08/19 at  3:40 PM EST by telephone and verified that I am speaking with the correct person using two identifiers.  Location: Patient: home Provider: office   I discussed the limitations, risks, security and privacy concerns of performing an evaluation and management service by telephone and the availability of in person appointments. I also discussed with the patient that there may be a patient responsible charge related to this service. The patient expressed understanding and agreed to proceed.   History of Present Illness:  Pt states states she developed cough around dec 12 th and she is coughing up some mucus. No fever, no chills or sweats. Then states hot flashes confuse her.  She states last week was wheezing a little. Now wheezing cleared up.  No muscle aches or ha.  Her cough seems to start up when exposed to cold air.     Observations/Objective: General- no acute distress, pleasant pt. Oriented. Normal speech  Assessment and Plan: You appear to have bronchitis. Rest hydrate and tylenol for fever. I am prescribing cough medicine benzonate, and azithromycin antibiotic. For your nasal congestion rx flonase.  Rx albuterol to use if needed for wheezing.  You should gradually get better. If not then notify us and would recommend a chest xray.  Follow up in 7-10 days or as needed  Covid 2 months ago so not concerned presently for reinfection but will watch close.  Follow Up Instructions:    I discussed the assessment and treatment plan with the patient. The patient was provided an opportunity to ask questions and all were answered. The patient agreed with the plan and demonstrated an understanding of the instructions.   The patient was advised to call back or seek an in-person  evaluation if the symptoms worsen or if the condition fails to improve as anticipated.  I provided 25 minutes of non-face-to-face time during this encounter.   Mackie Pai, PA-C    Review of Systems  Constitutional: Negative for chills, fatigue and fever.  HENT: Negative for congestion, ear pain, sinus pressure and sinus pain.   Respiratory: Positive for cough. Negative for chest tightness, shortness of breath and wheezing.        Chest congestion.  Cardiovascular: Negative for chest pain and palpitations.  Gastrointestinal: Negative for abdominal pain.  Musculoskeletal: Negative for back pain and myalgias.  Skin: Negative for rash.  Neurological: Negative for dizziness, speech difficulty, weakness and light-headedness.  Hematological: Negative for adenopathy. Does not bruise/bleed easily.  Psychiatric/Behavioral: Negative for behavioral problems, decreased concentration and suicidal ideas. The patient is not nervous/anxious.     Past Medical History:  Diagnosis Date  . Fibroids    patient denies fibroids  . Fibroids    patient denies fibroids  . Hay fever    Seasonal Allergies  . PONV (postoperative nausea and vomiting)      Social History   Socioeconomic History  . Marital status: Married    Spouse name: Not on file  . Number of children: Not on file  . Years of education: Not on file  . Highest education level: Not on file  Occupational History  . Not on file  Tobacco Use  . Smoking status: Former Smoker    Types: Cigarettes    Quit date: 1992    Years since  quitting: 28.9  . Smokeless tobacco: Never Used  Substance and Sexual Activity  . Alcohol use: No  . Drug use: No  . Sexual activity: Yes    Birth control/protection: Surgical  Other Topics Concern  . Not on file  Social History Narrative  . Not on file   Social Determinants of Health   Financial Resource Strain:   . Difficulty of Paying Living Expenses: Not on file  Food Insecurity:   .  Worried About Charity fundraiser in the Last Year: Not on file  . Ran Out of Food in the Last Year: Not on file  Transportation Needs:   . Lack of Transportation (Medical): Not on file  . Lack of Transportation (Non-Medical): Not on file  Physical Activity:   . Days of Exercise per Week: Not on file  . Minutes of Exercise per Session: Not on file  Stress:   . Feeling of Stress : Not on file  Social Connections:   . Frequency of Communication with Friends and Family: Not on file  . Frequency of Social Gatherings with Friends and Family: Not on file  . Attends Religious Services: Not on file  . Active Member of Clubs or Organizations: Not on file  . Attends Archivist Meetings: Not on file  . Marital Status: Not on file  Intimate Partner Violence:   . Fear of Current or Ex-Partner: Not on file  . Emotionally Abused: Not on file  . Physically Abused: Not on file  . Sexually Abused: Not on file    Past Surgical History:  Procedure Laterality Date  . nova sure    . ROBOTIC ASSISTED TOTAL HYSTERECTOMY WITH SALPINGECTOMY Bilateral 01/12/2016   Procedure: ROBOTIC ASSISTED TOTAL HYSTERECTOMY WITH BILATERAL SALPINGECTOMY;  Surgeon: Servando Salina, MD;  Location: [facility];  Service: Gynecology;  Laterality: Bilateral;  3 hrs.  . TUBAL LIGATION    . WISDOM TOOTH EXTRACTION      Family History  Problem Relation Age of Onset  . Hypertension Mother   . Angina Mother   . Fibromyalgia Mother   . Asthma Mother   . Depression Mother   . Arthritis Mother   . Diabetes Father   . Depression Brother   . Post-traumatic stress disorder Brother   . Breast cancer Maternal Aunt        Great Aunt, Maternal  . Allergies Daughter     Allergies  Allergen Reactions  . Codeine Other (See Comments)    Dizziness     Current Outpatient Medications on File Prior to Visit  Medication Sig Dispense Refill  . Flaxseed, Linseed, (FLAX SEEDS PO) Take 1 tablet by mouth daily.    . Multiple  Vitamins-Minerals (MULTIVITAMIN PO) Take by mouth. Women's One-A-Day     No current facility-administered medications on file prior to visit.    Pulse 94   LMP 12/24/2015 (Exact Date) Comment: January 2017  SpO2 98%       Objective:   Physical Exam        Assessment & Plan:

## 2019-12-08 NOTE — Patient Instructions (Addendum)
You appear to have bronchitis. Rest hydrate and tylenol for fever. I am prescribing cough medicine benzonate, and azithromycin antibiotic. For your nasal congestion rx flonase.  Rx albuterol to use if needed for wheezing.  You should gradually get better. If not then notify us and would recommend a chest xray.  Follow up in 7-10 days or as needed  Covid 2 months ago so not concerned presently for reinfection but will watch close.

## 2019-12-14 ENCOUNTER — Telehealth: Payer: Self-pay | Admitting: Medical

## 2019-12-14 ENCOUNTER — Encounter: Payer: Self-pay | Admitting: Medical

## 2019-12-14 MED ORDER — LOSARTAN POTASSIUM 25 MG PO TABS: 25.0000 mg | ORAL_TABLET | Freq: Every day | ORAL | 3 refills | Status: DC

## 2019-12-14 NOTE — Telephone Encounter (Signed)
Rx losartan sent to pt pharmacy.

## 2019-12-16 ENCOUNTER — Encounter: Payer: Self-pay | Admitting: Medical

## 2019-12-23 ENCOUNTER — Ambulatory Visit (INDEPENDENT_AMBULATORY_CARE_PROVIDER_SITE_OTHER): Payer: 59 | Admitting: Medical

## 2019-12-23 ENCOUNTER — Encounter: Payer: Self-pay | Admitting: Medical

## 2019-12-23 ENCOUNTER — Other Ambulatory Visit: Payer: Self-pay

## 2019-12-23 VITALS — BP 130/70 | HR 80 | Temp 96.2°F | Resp 18 | Wt 191.1 lb

## 2019-12-23 DIAGNOSIS — R739 Hyperglycemia, unspecified: Secondary | ICD-10-CM | POA: Diagnosis not present

## 2019-12-23 DIAGNOSIS — I10 Essential (primary) hypertension: Secondary | ICD-10-CM | POA: Diagnosis not present

## 2019-12-23 DIAGNOSIS — L509 Urticaria, unspecified: Secondary | ICD-10-CM | POA: Diagnosis not present

## 2019-12-23 DIAGNOSIS — Z23 Encounter for immunization: Secondary | ICD-10-CM | POA: Diagnosis not present

## 2019-12-23 LAB — COMPREHENSIVE METABOLIC PANEL
ALT: 29 U/L (ref 0–35)
AST: 19 U/L (ref 0–37)
Albumin: 4 g/dL (ref 3.5–5.2)
Alkaline Phosphatase: 69 U/L (ref 39–117)
BUN: 11 mg/dL (ref 6–23)
CO2: 28 mEq/L (ref 19–32)
Calcium: 9.4 mg/dL (ref 8.4–10.5)
Chloride: 105 mEq/L (ref 96–112)
Creatinine, Ser: 0.8 mg/dL (ref 0.40–1.20)
GFR: 91.26 mL/min (ref >60.00)
Glucose, Bld: 86 mg/dL (ref 70–99)
Potassium: 4.5 mEq/L (ref 3.5–5.1)
Sodium: 140 mEq/L (ref 135–145)
Total Bilirubin: 0.4 mg/dL (ref 0.2–1.2)
Total Protein: 6.7 g/dL (ref 6.0–8.3)

## 2019-12-23 LAB — CBC WITH DIFFERENTIAL/PLATELET
Basophils Absolute: 0 10*3/uL (ref 0.0–0.1)
Basophils Relative: 0.4 % (ref 0.0–3.0)
Eosinophils Absolute: 0.1 10*3/uL (ref 0.0–0.7)
Eosinophils Relative: 2.9 % (ref 0.0–5.0)
HCT: 38.7 % (ref 36.0–46.0)
Hemoglobin: 12.3 g/dL (ref 12.0–15.0)
Lymphocytes Relative: 39 % (ref 12.0–46.0)
Lymphs Abs: 1.4 10*3/uL (ref 0.7–4.0)
MCHC: 31.9 g/dL (ref 30.0–36.0)
MCV: 84.2 fl (ref 78.0–100.0)
Monocytes Absolute: 0.3 10*3/uL (ref 0.1–1.0)
Monocytes Relative: 7.7 % (ref 3.0–12.0)
Neutro Abs: 1.8 10*3/uL (ref 1.4–7.7)
Neutrophils Relative %: 50 % (ref 43.0–77.0)
Platelets: 248 10*3/uL (ref 150.0–400.0)
RBC: 4.6 Mil/uL (ref 3.87–5.11)
RDW: 14.7 % (ref 11.5–15.5)
WBC: 3.7 10*3/uL — ABNORMAL LOW (ref 4.0–10.5)

## 2019-12-23 LAB — HEMOGLOBIN A1C: Hgb A1c MFr Bld: 5.7 % (ref 4.6–6.5)

## 2019-12-23 LAB — AMMONIA: Ammonia: 22 umol/L (ref 11–35)

## 2019-12-23 NOTE — Patient Instructions (Signed)
For htn, recommend continue losartan. Will get cmp today.  For elevated sugar in past get a1c today.  For itching, advise use your hydroxyzine. Moisturize with palmers type product. Will refer to dermatologist.  Tdap today.  Follow up date to be determined after lab review.

## 2019-12-23 NOTE — Progress Notes (Signed)
Subjective:    Patient ID: [name], female    DOB: [birth year], 54 y.o.   MRN: [id number]  HPI Pt in for itching to skin for about one 5 years. Pt states her back, legs and arms itch. No rash presently. Pt uses nivia moisturizer. Sometimes uses vaseline. Pt states most of time itch at night and in morning.   In summer pt got prednisone taper and hydroxyzine.   Pt states when she used hydroxyzine helped with itch.   No sob, no wheezing, no lip swelling.  Itching seems worse at night and in the morning.   Pt has htn. Her bp is well controlled today. On losartan.  Pt due for tdap. Will get today.    Review of Systems  Constitutional: Negative for chills, fatigue and fever.  Respiratory: Negative for cough, chest tightness, shortness of breath and wheezing.   Cardiovascular: Negative for chest pain and palpitations.  Gastrointestinal: Negative for abdominal distention and blood in stool.  Musculoskeletal: Negative for back pain.  Skin: Negative for rash.  Neurological: Negative for dizziness, speech difficulty, numbness and headaches.  Hematological: Negative for adenopathy. Does not bruise/bleed easily.  Psychiatric/Behavioral: Negative for behavioral problems, decreased concentration and dysphoric mood.    Past Medical History:  Diagnosis Date  . Fibroids    patient denies fibroids  . Fibroids    patient denies fibroids  . Hay fever    Seasonal Allergies  . PONV (postoperative nausea and vomiting)      Social History   Socioeconomic History  . Marital status: Married    Spouse name: Not on file  . Number of children: Not on file  . Years of education: Not on file  . Highest education level: Not on file  Occupational History  . Not on file  Tobacco Use  . Smoking status: Former Smoker    Types: Cigarettes    Quit date: 1992    Years since quitting: 29.0  . Smokeless tobacco: Never Used  Substance and Sexual Activity  . Alcohol use: No  .  Drug use: No  . Sexual activity: Yes    Birth control/protection: Surgical  Other Topics Concern  . Not on file  Social History Narrative  . Not on file   Social Determinants of Health   Financial Resource Strain:   . Difficulty of Paying Living Expenses: Not on file  Food Insecurity:   . Worried About Charity fundraiser in the Last Year: Not on file  . Ran Out of Food in the Last Year: Not on file  Transportation Needs:   . Lack of Transportation (Medical): Not on file  . Lack of Transportation (Non-Medical): Not on file  Physical Activity:   . Days of Exercise per Week: Not on file  . Minutes of Exercise per Session: Not on file  Stress:   . Feeling of Stress : Not on file  Social Connections:   . Frequency of Communication with Friends and Family: Not on file  . Frequency of Social Gatherings with Friends and Family: Not on file  . Attends Religious Services: Not on file  . Active Member of Clubs or Organizations: Not on file  . Attends Archivist Meetings: Not on file  . Marital Status: Not on file  Intimate Partner Violence:   . Fear of Current or Ex-Partner: Not on file  . Emotionally Abused: Not on file  . Physically Abused: Not on file  . Sexually Abused: Not on file  Past Surgical History:  Procedure Laterality Date  . nova sure    . ROBOTIC ASSISTED TOTAL HYSTERECTOMY WITH SALPINGECTOMY Bilateral 01/12/2016   Procedure: ROBOTIC ASSISTED TOTAL HYSTERECTOMY WITH BILATERAL SALPINGECTOMY;  Surgeon: Servando Salina, MD;  Location: [facility];  Service: Gynecology;  Laterality: Bilateral;  3 hrs.  . TUBAL LIGATION    . WISDOM TOOTH EXTRACTION      Family History  Problem Relation Age of Onset  . Hypertension Mother   . Angina Mother   . Fibromyalgia Mother   . Asthma Mother   . Depression Mother   . Arthritis Mother   . Diabetes Father   . Depression Brother   . Post-traumatic stress disorder Brother   . Breast cancer Maternal Aunt        Great  Aunt, Maternal  . Allergies Daughter     Allergies  Allergen Reactions  . Codeine Other (See Comments)    Dizziness     Current Outpatient Medications on File Prior to Visit  Medication Sig Dispense Refill  . albuterol (VENTOLIN HFA) 108 (90 Base) MCG/ACT inhaler Inhale 2 puffs into the lungs every 6 (six) hours as needed. 18 g 0  . Flaxseed, Linseed, (FLAX SEEDS PO) Take 1 tablet by mouth daily.    . fluticasone (FLONASE) 50 MCG/ACT nasal spray Place 2 sprays into both nostrils daily. 16 g 1  . losartan (COZAAR) 25 MG tablet Take 1 tablet (25 mg total) by mouth daily. 30 tablet 3  . Multiple Vitamins-Minerals (MULTIVITAMIN PO) Take by mouth. Women's One-A-Day    . benzonatate (TESSALON) 100 MG capsule Take 1 capsule (100 mg total) by mouth 3 (three) times daily as needed for cough. (Patient not taking: Reported on 12/23/2019) 30 capsule 0   No current facility-administered medications on file prior to visit.    BP 130/70 (BP Location: Left Arm, Patient Position: Sitting, Cuff Size: Normal)   Pulse 80   Temp (!) 96.2 F (35.7 C) (Temporal)   Resp 18   Wt 191 lb 2 oz (86.7 kg)   LMP 12/24/2015 (Exact Date) Comment: January 2017  SpO2 98%   BMI 32.81 kg/m       Objective:   Physical Exam  General Mental Status- Alert. General Appearance- Not in acute distress.   Skin General: Color- Normal Color. Moisture- Normal Moisture.  Neck Carotid Arteries- Normal color. Moisture- Normal Moisture. No carotid bruits. No JVD.  Chest and Lung Exam Auscultation: Breath Sounds:-Normal.  Cardiovascular Auscultation:Rythm- Regular. Murmurs & Other Heart Sounds:Auscultation of the heart reveals- No Murmurs.  Abdomen Inspection:-Inspeection Normal. Palpation/Percussion:Note:No mass. Palpation and Percussion of the abdomen reveal- Non Tender, Non Distended + BS, no rebound or guarding.   Neurologic Cranial Nerve exam:- CN III-XII intact(No nystagmus), symmetric smile. Strength:-  5/5 equal and symmetric strength both upper and lower extremities.      Assessment & Plan:  For htn, recommend continue losartan. Will get cmp today.  For elevated sugar in past get a1c today.  For itching, advise use your hydroxyzine. Moisturize with palmers type product. Will refer to dermatologist.  Tdap today.  Follow up date to be determined after lab review.  Mckennah Kretchmer, PA-C    20 minutes spent with pt. 50% of time spent counseling on plan going forward.

## 2020-03-04 ENCOUNTER — Ambulatory Visit (INDEPENDENT_AMBULATORY_CARE_PROVIDER_SITE_OTHER): Payer: 59 | Admitting: Medical

## 2020-03-04 ENCOUNTER — Encounter: Payer: Self-pay | Admitting: Medical

## 2020-03-04 ENCOUNTER — Other Ambulatory Visit: Payer: Self-pay

## 2020-03-04 VITALS — BP 136/92

## 2020-03-04 DIAGNOSIS — I10 Essential (primary) hypertension: Secondary | ICD-10-CM

## 2020-03-04 DIAGNOSIS — F419 Anxiety disorder, unspecified: Secondary | ICD-10-CM

## 2020-03-04 MED ORDER — BUSPIRONE HCL 7.5 MG PO TABS: 7.5000 mg | ORAL_TABLET | Freq: Two times a day (BID) | ORAL | 0 refills | Status: DC

## 2020-03-04 NOTE — Progress Notes (Signed)
   Subjective:    Patient ID: [name], female    DOB: Jan 20, 1966, 54 y.o.   MRN: [id number]  HPI  Virtual Visit via Video Note  I connected with [name] on 03/04/20 at  2:00 PM EDT by a video enabled telemedicine application and verified that I am speaking with the correct person using two identifiers.  Location: Patient: car at work. Provider: office   I discussed the limitations of evaluation and management by telemedicine and the availability of in person appointments. The patient expressed understanding and agreed to proceed.  History of Present Illness:  Pt states in past she has hx of some depression, anxiety and stress. 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 She states she has not had a day off since November. Even on weekends having to work/do scheduling. She states 3 years ago made her self too stressed and actually walked off job.  Pt states this morning felt like headed and dizzy. She had tingling to face. (She states still feel odd sensation in face) She was feeling overwhelmed at work at that time. No gait disturbance.  Pt has not checked her blood pressure today. The other day was 129/87. Pt did take her bp medicine today.  In the past pt was on effexor and she states that made her jittery. Currently she is on [employer] warts.       Observations/Objective: General-no acute distress, pleasant, oriented. Lungs- on inspection lungs appear unlabored. Neck- no tracheal deviation or jvd on inspection. Neuro- gross motor function appears intact. symetric smile, finger to nose intact. No hand drift.  Assessment and Plan: You have recent high level stress and anxiety, Will presrribe buspar and have you start that today. Decided against ssri today as anxiety appears to more prominent issue. Also work note sent to my chart excusing you from working rest of afternoon.  You have htn hx and recent transient dizziness and face  tingling. I need you to check your bp at home and my chart me. If high bp  and your symptoms persist or worsen then ED evaluation. Important to not assume you symptoms are not anxiety/stress related. Pt expressed understanding.  Follow up 7 days or as needed  Follow Up Instructions:    I discussed the assessment and treatment plan with the patient. The patient was provided an opportunity to ask questions and all were answered. The patient agreed with the plan and demonstrated an understanding of the instructions.   The patient was advised to call back or seek an in-person evaluation if the symptoms worsen or if the condition fails to improve as anticipated.  I provided 25 minutes of non-face-to-face time during this encounter.   Mackie Pai, PA-C   Review of Systems  Constitutional: Negative for chills, fatigue and fever.  Respiratory: Negative for cough, chest tightness, shortness of breath and wheezing.   Cardiovascular: Negative for chest pain and palpitations.  Gastrointestinal: Negative for abdominal pain.  Musculoskeletal: Negative for back pain.  Neurological:       See hpi.  Hematological: Negative for adenopathy.  Psychiatric/Behavioral: Positive for dysphoric mood. Negative for agitation, behavioral problems, decreased concentration, sleep disturbance and suicidal ideas. The patient is nervous/anxious.        So stressed effecting mood.       Objective:   Physical Exam        Assessment & Plan:

## 2020-03-04 NOTE — Patient Instructions (Signed)
You have recent high level stress and anxiety, Will presrribe buspar and have you start that today. Decided against ssri today as anxiety appears to more prominent issue. Also work note sent to my chart excusing you from working rest of afternoon.  You have htn hx and recent transient dizziness and face tingling. I need you to check your bp at home and my chart me. If high bp  and your symptoms persist or worsen then ED evaluation. Important to not assume you symptoms are not anxiety/stress related. Pt expressed understanding.  Follow up 7 days or as needed

## 2020-03-28 ENCOUNTER — Other Ambulatory Visit: Payer: Self-pay | Admitting: Medical

## 2020-04-08 ENCOUNTER — Other Ambulatory Visit: Payer: Self-pay | Admitting: Medical

## 2020-05-02 ENCOUNTER — Other Ambulatory Visit: Payer: Self-pay | Admitting: Medical

## 2020-06-03 ENCOUNTER — Other Ambulatory Visit: Payer: Self-pay | Admitting: Medical

## 2020-07-05 ENCOUNTER — Other Ambulatory Visit: Payer: Self-pay | Admitting: Medical

## 2020-08-06 ENCOUNTER — Other Ambulatory Visit: Payer: Self-pay | Admitting: Medical

## 2020-08-07 ENCOUNTER — Other Ambulatory Visit: Payer: Self-pay | Admitting: Medical

## 2020-08-16 ENCOUNTER — Encounter: Payer: Self-pay | Admitting: Medical

## 2020-08-23 ENCOUNTER — Other Ambulatory Visit: Payer: Self-pay

## 2020-08-23 ENCOUNTER — Ambulatory Visit (INDEPENDENT_AMBULATORY_CARE_PROVIDER_SITE_OTHER): Payer: 59 | Admitting: Medical

## 2020-08-23 VITALS — BP 115/69 | HR 80 | Temp 98.4°F | Resp 18 | Ht 64.0 in | Wt 185.0 lb

## 2020-08-23 DIAGNOSIS — G4700 Insomnia, unspecified: Secondary | ICD-10-CM

## 2020-08-23 DIAGNOSIS — I10 Essential (primary) hypertension: Secondary | ICD-10-CM | POA: Diagnosis not present

## 2020-08-23 DIAGNOSIS — R252 Cramp and spasm: Secondary | ICD-10-CM | POA: Diagnosis not present

## 2020-08-23 DIAGNOSIS — F419 Anxiety disorder, unspecified: Secondary | ICD-10-CM

## 2020-08-23 MED ORDER — TRAZODONE HCL 50 MG PO TABS: 25.0000 mg | ORAL_TABLET | Freq: Every evening | ORAL | 0 refills | Status: DC | PRN

## 2020-08-23 NOTE — Progress Notes (Signed)
Subjective:    Patient ID: [name], female    DOB: 1966/07/08, 54 y.o.   MRN: [id number]  HPI Pt in for insomnia. Pt states 2-3 months back started having trouble sleeping.  Pt states stress related to work and marriage. Pt states sometimes can't sleep at all.  Pt drinks some coffee but max 1 cup and never past lunch.  Pt is on buspar. Pt states her new job is less stressful. Marriage stress for 2 years.     Review of Systems  Constitutional: Negative for chills, fatigue and fever.  Respiratory: Negative for chest tightness, shortness of breath and wheezing.   Cardiovascular: Negative for chest pain and palpitations.  Gastrointestinal: Negative for abdominal pain.  Musculoskeletal: Negative for back pain.  Skin: Negative for rash.  Neurological: Negative for dizziness, syncope, speech difficulty, weakness, numbness and headaches.  Hematological: Negative for adenopathy. Does not bruise/bleed easily.  Psychiatric/Behavioral: Positive for sleep disturbance. Negative for behavioral problems, decreased concentration, dysphoric mood and suicidal ideas. The patient is nervous/anxious.     Past Medical History:  Diagnosis Date  . Fibroids    patient denies fibroids  . Fibroids    patient denies fibroids  . Hay fever    Seasonal Allergies  . PONV (postoperative nausea and vomiting)      Social History   Socioeconomic History  . Marital status: Married    Spouse name: Not on file  . Number of children: Not on file  . Years of education: Not on file  . Highest education level: Not on file  Occupational History  . Not on file  Tobacco Use  . Smoking status: Former Smoker    Types: Cigarettes    Quit date: 1992    Years since quitting: 29.7  . Smokeless tobacco: Never Used  Substance and Sexual Activity  . Alcohol use: No  . Drug use: No  . Sexual activity: Yes    Birth control/protection: Surgical  Other Topics Concern  . Not on file  Social  History Narrative  . Not on file   Social Determinants of Health   Financial Resource Strain:   . Difficulty of Paying Living Expenses: Not on file  Food Insecurity:   . Worried About Charity fundraiser in the Last Year: Not on file  . Ran Out of Food in the Last Year: Not on file  Transportation Needs:   . Lack of Transportation (Medical): Not on file  . Lack of Transportation (Non-Medical): Not on file  Physical Activity:   . Days of Exercise per Week: Not on file  . Minutes of Exercise per Session: Not on file  Stress:   . Feeling of Stress : Not on file  Social Connections:   . Frequency of Communication with Friends and Family: Not on file  . Frequency of Social Gatherings with Friends and Family: Not on file  . Attends Religious Services: Not on file  . Active Member of Clubs or Organizations: Not on file  . Attends Archivist Meetings: Not on file  . Marital Status: Not on file  Intimate Partner Violence:   . Fear of Current or Ex-Partner: Not on file  . Emotionally Abused: Not on file  . Physically Abused: Not on file  . Sexually Abused: Not on file    Past Surgical History:  Procedure Laterality Date  . nova sure    . ROBOTIC ASSISTED TOTAL HYSTERECTOMY WITH SALPINGECTOMY Bilateral 01/12/2016   Procedure: ROBOTIC ASSISTED TOTAL  HYSTERECTOMY WITH BILATERAL SALPINGECTOMY;  Surgeon: Servando Salina, MD;  Location: [facility];  Service: Gynecology;  Laterality: Bilateral;  3 hrs.  . TUBAL LIGATION    . WISDOM TOOTH EXTRACTION      Family History  Problem Relation Age of Onset  . Hypertension Mother   . Angina Mother   . Fibromyalgia Mother   . Asthma Mother   . Depression Mother   . Arthritis Mother   . Diabetes Father   . Depression Brother   . Post-traumatic stress disorder Brother   . Breast cancer Maternal Aunt        Great Aunt, Maternal  . Allergies Daughter     Allergies  Allergen Reactions  . Codeine Other (See Comments)    Dizziness      Current Outpatient Medications on File Prior to Visit  Medication Sig Dispense Refill  . albuterol (VENTOLIN HFA) 108 (90 Base) MCG/ACT inhaler Inhale 2 puffs into the lungs every 6 (six) hours as needed. 18 g 0  . benzonatate (TESSALON) 100 MG capsule Take 1 capsule (100 mg total) by mouth 3 (three) times daily as needed for cough. (Patient not taking: Reported on 12/23/2019) 30 capsule 0  . busPIRone (BUSPAR) 7.5 MG tablet TAKE 1 TABLET BY MOUTH TWICE A DAY 60 tablet 0  . Flaxseed, Linseed, (FLAX SEEDS PO) Take 1 tablet by mouth daily.    . fluticasone (FLONASE) 50 MCG/ACT nasal spray Place 2 sprays into both nostrils daily. 16 g 1  . losartan (COZAAR) 25 MG tablet TAKE 1 TABLET(25 MG) BY MOUTH DAILY 30 tablet 3  . Multiple Vitamins-Minerals (MULTIVITAMIN PO) Take by mouth. Women's One-A-Day     No current facility-administered medications on file prior to visit.    BP 115/69   Pulse 80   Temp 98.4 F (36.9 C) (Oral)   Resp 18   Ht 5\' 4"  (1.626 m)   Wt 185 lb (83.9 kg)   LMP 12/24/2015 (Exact Date) Comment: January 2017  SpO2 100%   BMI 31.76 kg/m       Objective:   Physical Exam   General Mental Status- Alert. General Appearance- Not in acute distress.   Skin General: Color- Normal Color. Moisture- Normal Moisture.  Neck Carotid Arteries- Normal color. Moisture- Normal Moisture. No carotid bruits. No JVD.  Chest and Lung Exam Auscultation: Breath Sounds:-Normal.  Cardiovascular Auscultation:Rythm- Regular. Murmurs & Other Heart Sounds:Auscultation of the heart reveals- No Murmurs.  Abdomen Inspection:-Inspeection Normal. Palpation/Percussion:Note:No mass. Palpation and Percussion of the abdomen reveal- Non Tender, Non Distended + BS, no rebound or guarding.   Neurologic Cranial Nerve exam:- CN III-XII intact(No nystagmus), symmetric smile. Strength:- 5/5 equal and symmetric strength both upper and lower extremities.     Assessment & Plan:  Your bp is  well controlled. Continue losartan.  For anxiety, continue buspar. Anxiety is well controlled but difficulty relaxing/destressing at night.  For insomnia rx trazadone. Can also try melatonin otc 5 mg dose. But try trazadone.  For occasional leg cramps get cmp and mg. Can call and get scheduled for those labs.  Follow up in one month or as needed  [employer], PA-C

## 2020-08-23 NOTE — Patient Instructions (Addendum)
Your bp is well controlled. Continue losartan.  For anxiety, continue buspar. Anxiety is well controlled but difficulty relaxing/destressing at night.  For insomnia rx trazadone. Can also try melatonin otc 5 mg dose. But try trazadone.  For occasional leg cramps get cmp and mg. Can call and get scheduled for those labs.  Follow up in one month or as needed

## 2020-08-30 ENCOUNTER — Other Ambulatory Visit: Payer: Self-pay | Admitting: Obstetrics and Gynecology

## 2020-08-30 DIAGNOSIS — Z0000 Encounter for general adult medical examination without abnormal findings: Secondary | ICD-10-CM

## 2020-09-08 ENCOUNTER — Other Ambulatory Visit: Payer: Self-pay | Admitting: Medical

## 2020-09-13 ENCOUNTER — Ambulatory Visit
Admission: RE | Admit: 2020-09-13 | Discharge: 2020-09-13 | Disposition: A | Discharge status: Home / Self Care | Payer: 59 | Source: Ambulatory Visit | Attending: Obstetrics and Gynecology | Admitting: Obstetrics and Gynecology

## 2020-09-13 ENCOUNTER — Other Ambulatory Visit: Payer: Self-pay

## 2020-09-13 DIAGNOSIS — Z0000 Encounter for general adult medical examination without abnormal findings: Secondary | ICD-10-CM

## 2020-09-25 ENCOUNTER — Encounter: Payer: Self-pay | Admitting: Medical

## 2020-10-09 ENCOUNTER — Other Ambulatory Visit: Payer: Self-pay | Admitting: Medical

## 2020-11-12 ENCOUNTER — Other Ambulatory Visit: Payer: Self-pay | Admitting: Medical

## 2020-11-29 DIAGNOSIS — N951 Menopausal and female climacteric states: Secondary | ICD-10-CM | POA: Diagnosis not present

## 2020-11-29 DIAGNOSIS — I10 Essential (primary) hypertension: Secondary | ICD-10-CM | POA: Diagnosis not present

## 2020-11-29 DIAGNOSIS — R5383 Other fatigue: Secondary | ICD-10-CM | POA: Diagnosis not present

## 2020-12-04 ENCOUNTER — Other Ambulatory Visit: Payer: Self-pay | Admitting: Medical

## 2020-12-05 DIAGNOSIS — R5383 Other fatigue: Secondary | ICD-10-CM | POA: Diagnosis not present

## 2020-12-05 DIAGNOSIS — N951 Menopausal and female climacteric states: Secondary | ICD-10-CM | POA: Diagnosis not present

## 2020-12-05 DIAGNOSIS — I10 Essential (primary) hypertension: Secondary | ICD-10-CM | POA: Diagnosis not present

## 2020-12-16 ENCOUNTER — Other Ambulatory Visit: Payer: Self-pay | Admitting: Medical

## 2020-12-20 DIAGNOSIS — I10 Essential (primary) hypertension: Secondary | ICD-10-CM | POA: Diagnosis not present

## 2020-12-20 DIAGNOSIS — R5383 Other fatigue: Secondary | ICD-10-CM | POA: Diagnosis not present

## 2020-12-20 DIAGNOSIS — N951 Menopausal and female climacteric states: Secondary | ICD-10-CM | POA: Diagnosis not present

## 2020-12-26 DIAGNOSIS — Z6832 Body mass index (BMI) 32.0-32.9, adult: Secondary | ICD-10-CM | POA: Diagnosis not present

## 2020-12-26 DIAGNOSIS — E559 Vitamin D deficiency, unspecified: Secondary | ICD-10-CM | POA: Diagnosis not present

## 2021-01-06 ENCOUNTER — Encounter: Payer: Self-pay | Admitting: Medical

## 2021-01-11 ENCOUNTER — Encounter: Payer: Self-pay | Admitting: Medical

## 2021-01-12 ENCOUNTER — Telehealth: Payer: Self-pay | Admitting: Medical

## 2021-01-12 DIAGNOSIS — Z1211 Encounter for screening for malignant neoplasm of colon: Secondary | ICD-10-CM

## 2021-01-12 DIAGNOSIS — K635 Polyp of colon: Secondary | ICD-10-CM

## 2021-01-12 NOTE — Telephone Encounter (Signed)
Referral to gi placed. °

## 2021-01-16 DIAGNOSIS — Z6832 Body mass index (BMI) 32.0-32.9, adult: Secondary | ICD-10-CM | POA: Diagnosis not present

## 2021-01-16 DIAGNOSIS — I10 Essential (primary) hypertension: Secondary | ICD-10-CM | POA: Diagnosis not present

## 2021-01-23 DIAGNOSIS — Z6832 Body mass index (BMI) 32.0-32.9, adult: Secondary | ICD-10-CM | POA: Diagnosis not present

## 2021-01-23 DIAGNOSIS — E559 Vitamin D deficiency, unspecified: Secondary | ICD-10-CM | POA: Diagnosis not present

## 2021-01-30 DIAGNOSIS — Z789 Other specified health status: Secondary | ICD-10-CM | POA: Diagnosis not present

## 2021-01-30 DIAGNOSIS — R5383 Other fatigue: Secondary | ICD-10-CM | POA: Diagnosis not present

## 2021-02-07 DIAGNOSIS — Z6831 Body mass index (BMI) 31.0-31.9, adult: Secondary | ICD-10-CM | POA: Diagnosis not present

## 2021-02-07 DIAGNOSIS — E559 Vitamin D deficiency, unspecified: Secondary | ICD-10-CM | POA: Diagnosis not present

## 2021-02-14 ENCOUNTER — Other Ambulatory Visit: Payer: Self-pay

## 2021-02-14 ENCOUNTER — Ambulatory Visit (HOSPITAL_BASED_OUTPATIENT_CLINIC_OR_DEPARTMENT_OTHER)
Admission: RE | Admit: 2021-02-14 | Discharge: 2021-02-14 | Disposition: A | Discharge status: Home / Self Care | Payer: 59 | Source: Ambulatory Visit | Attending: Medical | Admitting: Medical

## 2021-02-14 ENCOUNTER — Ambulatory Visit (INDEPENDENT_AMBULATORY_CARE_PROVIDER_SITE_OTHER): Payer: 59 | Admitting: Medical

## 2021-02-14 VITALS — BP 123/69 | HR 90 | Temp 98.2°F | Resp 18 | Ht 64.0 in | Wt 181.0 lb

## 2021-02-14 DIAGNOSIS — M79602 Pain in left arm: Secondary | ICD-10-CM | POA: Diagnosis not present

## 2021-02-14 DIAGNOSIS — J309 Allergic rhinitis, unspecified: Secondary | ICD-10-CM | POA: Diagnosis not present

## 2021-02-14 DIAGNOSIS — R739 Hyperglycemia, unspecified: Secondary | ICD-10-CM | POA: Diagnosis not present

## 2021-02-14 DIAGNOSIS — Z111 Encounter for screening for respiratory tuberculosis: Secondary | ICD-10-CM

## 2021-02-14 DIAGNOSIS — Z0000 Encounter for general adult medical examination without abnormal findings: Secondary | ICD-10-CM

## 2021-02-14 LAB — CBC WITH DIFFERENTIAL/PLATELET
Basophils Absolute: 0 10*3/uL (ref 0.0–0.1)
Basophils Relative: 1 % (ref 0.0–3.0)
Eosinophils Absolute: 0.2 10*3/uL (ref 0.0–0.7)
Eosinophils Relative: 5.3 % — ABNORMAL HIGH (ref 0.0–5.0)
HCT: 37.4 % (ref 36.0–46.0)
Hemoglobin: 12.1 g/dL (ref 12.0–15.0)
Lymphocytes Relative: 27.9 % (ref 12.0–46.0)
Lymphs Abs: 1.2 10*3/uL (ref 0.7–4.0)
MCHC: 32.5 g/dL (ref 30.0–36.0)
MCV: 84.1 fl (ref 78.0–100.0)
Monocytes Absolute: 0.3 10*3/uL (ref 0.1–1.0)
Monocytes Relative: 7.4 % (ref 3.0–12.0)
Neutro Abs: 2.6 10*3/uL (ref 1.4–7.7)
Neutrophils Relative %: 58.4 % (ref 43.0–77.0)
Platelets: 261 10*3/uL (ref 150.0–400.0)
RBC: 4.45 Mil/uL (ref 3.87–5.11)
RDW: 14.6 % (ref 11.5–15.5)
WBC: 4.4 10*3/uL (ref 4.0–10.5)

## 2021-02-14 LAB — COMPREHENSIVE METABOLIC PANEL
ALT: 26 U/L (ref 0–35)
AST: 17 U/L (ref 0–37)
Albumin: 4.1 g/dL (ref 3.5–5.2)
Alkaline Phosphatase: 51 U/L (ref 39–117)
BUN: 14 mg/dL (ref 6–23)
CO2: 28 mEq/L (ref 19–32)
Calcium: 9.3 mg/dL (ref 8.4–10.5)
Chloride: 104 mEq/L (ref 96–112)
Creatinine, Ser: 0.84 mg/dL (ref 0.40–1.20)
GFR: 79.68 mL/min (ref >60.00)
Glucose, Bld: 85 mg/dL (ref 70–99)
Potassium: 4.1 mEq/L (ref 3.5–5.1)
Sodium: 138 mEq/L (ref 135–145)
Total Bilirubin: 0.6 mg/dL (ref 0.2–1.2)
Total Protein: 6.9 g/dL (ref 6.0–8.3)

## 2021-02-14 LAB — LIPID PANEL
Cholesterol: 162 mg/dL (ref 0–200)
HDL: 53.7 mg/dL (ref >39.00)
LDL Cholesterol: 99 mg/dL (ref 0–99)
NonHDL: 107.95
Total CHOL/HDL Ratio: 3
Triglycerides: 43 mg/dL (ref 0.0–149.0)
VLDL: 8.6 mg/dL (ref 0.0–40.0)

## 2021-02-14 LAB — HEMOGLOBIN A1C: Hgb A1c MFr Bld: 5.8 % (ref 4.6–6.5)

## 2021-02-14 MED ORDER — LEVOCETIRIZINE DIHYDROCHLORIDE 5 MG PO TABS: 5.0000 mg | ORAL_TABLET | Freq: Every evening | ORAL | 3 refills | Status: DC

## 2021-02-14 MED ORDER — MONTELUKAST SODIUM 10 MG PO TABS: 10.0000 mg | ORAL_TABLET | Freq: Every day | ORAL | 3 refills | Status: DC

## 2021-02-14 NOTE — Progress Notes (Signed)
Subjective:    Patient ID: [name], female    DOB: 1966-09-24, 55 y.o.   MRN: [id number]  HPI  Pt in for wellness exam. She is fasting.  Patient here for annual physical.    Total hysterectomy in 2017, still has ovaries per patient. GYN still does paps - normal in 2019. Mammogram 09-08-20.   Patient had colonoscopy last year, 3 year follow-up per patient - polyps removed. Done in 2020 and todld to repeat in 5 years.  Patient denies tobacco, alcohol and drug use.   Would like flu vaccine today.  Pt went to gun range outside and afterwards she was sneezing and runny nose. Some itching eyes. Also works in Health visitor.   Review of Systems  Constitutional: Negative for chills, fatigue and fever.  HENT: Positive for congestion, postnasal drip, sinus pressure and sneezing. Negative for ear discharge, facial swelling and sore throat.   Respiratory: Negative for cough, choking, shortness of breath and wheezing.   Cardiovascular: Negative for chest pain and palpitations.  Gastrointestinal: Negative for abdominal pain.  Genitourinary: Negative for dysuria, flank pain and frequency.  Musculoskeletal: Negative for back pain.       One month ago pt had some left elbow pain. Last 4 about 2 minutes. Was at rest. No other associated symptoms particlarly no chest pain.   Skin: Negative for rash.  Neurological: Negative for dizziness and headaches.  Hematological: Negative for adenopathy. Does not bruise/bleed easily.  Psychiatric/Behavioral: Negative for behavioral problems and decreased concentration. The patient is not nervous/anxious.     Past Medical History:  Diagnosis Date  . Fibroids    patient denies fibroids  . Fibroids    patient denies fibroids  . Hay fever    Seasonal Allergies  . PONV (postoperative nausea and vomiting)      Social History   Socioeconomic History  . Marital status: Married    Spouse name: Not on file  . Number of  children: Not on file  . Years of education: Not on file  . Highest education level: Not on file  Occupational History  . Not on file  Tobacco Use  . Smoking status: Former Smoker    Types: Cigarettes    Quit date: 1992    Years since quitting: 30.1  . Smokeless tobacco: Never Used  Substance and Sexual Activity  . Alcohol use: No  . Drug use: No  . Sexual activity: Yes    Birth control/protection: Surgical  Other Topics Concern  . Not on file  Social History Narrative  . Not on file   Social Determinants of Health   Financial Resource Strain: Not on file  Food Insecurity: Not on file  Transportation Needs: Not on file  Physical Activity: Not on file  Stress: Not on file  Social Connections: Not on file  Intimate Partner Violence: Not on file    Past Surgical History:  Procedure Laterality Date  . nova sure    . ROBOTIC ASSISTED TOTAL HYSTERECTOMY WITH SALPINGECTOMY Bilateral 01/12/2016   Procedure: ROBOTIC ASSISTED TOTAL HYSTERECTOMY WITH BILATERAL SALPINGECTOMY;  Surgeon: Servando Salina, MD;  Location: [facility];  Service: Gynecology;  Laterality: Bilateral;  3 hrs.  . TUBAL LIGATION    . WISDOM TOOTH EXTRACTION      Family History  Problem Relation Age of Onset  . Hypertension Mother   . Angina Mother   . Fibromyalgia Mother   . Asthma Mother   . Depression Mother   . Arthritis  Mother   . Diabetes Father   . Depression Brother   . Post-traumatic stress disorder Brother   . Breast cancer Maternal Aunt        Great Aunt, Maternal  . Allergies Daughter     Allergies  Allergen Reactions  . Codeine Other (See Comments)    Dizziness     Current Outpatient Medications on File Prior to Visit  Medication Sig Dispense Refill  . albuterol (VENTOLIN HFA) 108 (90 Base) MCG/ACT inhaler Inhale 2 puffs into the lungs every 6 (six) hours as needed. 18 g 0  . busPIRone (BUSPAR) 7.5 MG tablet TAKE 1 TABLET BY MOUTH TWICE A DAY 60 tablet 0  . Flaxseed, Linseed,  (FLAX SEEDS PO) Take 1 tablet by mouth daily.    Marland Kitchen losartan (COZAAR) 25 MG tablet TAKE 1 TABLET(25 MG) BY MOUTH DAILY 30 tablet 3  . Multiple Vitamins-Minerals (MULTIVITAMIN PO) Take by mouth. Women's One-A-Day    . traZODone (DESYREL) 50 MG tablet Take 0.5-1 tablets (25-50 mg total) by mouth at bedtime as needed for sleep. 30 tablet 0  . benzonatate (TESSALON) 100 MG capsule Take 1 capsule (100 mg total) by mouth 3 (three) times daily as needed for cough. (Patient not taking: No sig reported) 30 capsule 0  . fluticasone (FLONASE) 50 MCG/ACT nasal spray Place 2 sprays into both nostrils daily. (Patient not taking: Reported on 02/14/2021) 16 g 1   No current facility-administered medications on file prior to visit.    BP 123/69   Pulse 90   Temp 98.2 F (36.8 C) (Oral)   Resp 18   Ht 5\' 4"  (1.626 m)   Wt 181 lb (82.1 kg)   LMP 12/24/2015 (Exact Date) Comment: January 2017  SpO2 92%   BMI 31.07 kg/m       Objective:   Physical Exam  General Mental Status- Alert. General Appearance- Not in acute distress.   Skin General: Color- Normal Color. Moisture- Normal Moisture.  Neck Carotid Arteries- Normal color. Moisture- Normal Moisture. No carotid bruits. No JVD.  Chest and Lung Exam Auscultation: Breath Sounds:-Normal.  Cardiovascular Auscultation:Rythm- Regular. Murmurs & Other Heart Sounds:Auscultation of the heart reveals- No Murmurs.  Abdomen Inspection:-Inspeection Normal. Palpation/Percussion:Note:No mass. Palpation and Percussion of the abdomen reveal- Non Tender, Non Distended + BS, no rebound or guarding.  Left upper ext- mild tenderness to palpation posterior upper arm tricep tenderness. Elbow not tender to palpation.    Neurologic Cranial Nerve exam:- CN III-XII intact(No nystagmus), symmetric smile. Strength:- 5/5 equal and symmetric strength both upper and lower extremities.      Assessment & Plan:  For you wellness exam today I have ordered cbc, cmp and   lipid panel.  For elevated sugar in past get a1c.   Vaccine appear up to date. For shingles vaccine ask if your insurance covers.   Recommend exercise and healthy diet.  We will let you know lab results as they come in.  Follow up date appointment will be determined after lab review.   For allergies rx xyzal, continue flonase and use montelukast/add in 5-7 days if needed.  For left arm pain get humerus xray. Pain may be muscular but want to evaluate bone.  Tb screening lab.  Follow up date to be determined after lab review.  Mackie Pai, [place]   [id number] as did address allergic rhinitis and arm pain.

## 2021-02-14 NOTE — Patient Instructions (Addendum)
For you wellness exam today I have ordered cbc, cmp and  lipid panel.  For elevated sugar in past get a1c.   Vaccine appear up to date. For shingles vaccine ask if your insurance covers.   Recommend exercise and healthy diet.  We will let you know lab results as they come in.  Follow up date appointment will be determined after lab review.   For allergies rx xyzal, continue flonase and use montelukast/add in 5-7 days if needed.  For left arm pain get humerus xray. Pain may be muscular but want to evaluate bone.  Tb screening lab.  Follow up date to be determined after lab review.   Preventive Care 59-82 Years Old, Female Preventive care refers to lifestyle choices and visits with your health care provider that can promote health and wellness. This includes:  A yearly physical exam. This is also called an annual wellness visit.  Regular dental and eye exams.  Immunizations.  Screening for certain conditions.  Healthy lifestyle choices, such as: ? Eating a healthy diet. ? Getting regular exercise. ? Not using drugs or products that contain nicotine and tobacco. ? Limiting alcohol use. What can I expect for my preventive care visit? Physical exam Your health care provider will check your:  Height and weight. These may be used to calculate your BMI (body mass index). BMI is a measurement that tells if you are at a healthy weight.  Heart rate and blood pressure.  Body temperature.  Skin for abnormal spots. Counseling Your health care provider may ask you questions about your:  Past medical problems.  Family's medical history.  Alcohol, tobacco, and drug use.  Emotional well-being.  Home life and relationship well-being.  Sexual activity.  Diet, exercise, and sleep habits.  Work and work Statistician.  Access to firearms.  Method of birth control.  Menstrual cycle.  Pregnancy history. What immunizations do I need? Vaccines are usually given at  various ages, according to a schedule. Your health care provider will recommend vaccines for you based on your age, medical history, and lifestyle or other factors, such as travel or where you work.   What tests do I need? Blood tests  Lipid and cholesterol levels. These may be checked every 5 years, or more often if you are over 64 years old.  Hepatitis C test.  Hepatitis B test. Screening  Lung cancer screening. You may have this screening every year starting at age 41 if you have a 30-pack-year history of smoking and currently smoke or have quit within the past 15 years.  Colorectal cancer screening. ? All adults should have this screening starting at age 31 and continuing until age 56. ? Your health care provider may recommend screening at age 95 if you are at increased risk. ? You will have tests every 1-10 years, depending on your results and the type of screening test.  Diabetes screening. ? This is done by checking your blood sugar (glucose) after you have not eaten for a while (fasting). ? You may have this done every 1-3 years.  Mammogram. ? This may be done every 1-2 years. ? Talk with your health care provider about when you should start having regular mammograms. This may depend on whether you have a family history of breast cancer.  BRCA-related cancer screening. This may be done if you have a family history of breast, ovarian, tubal, or peritoneal cancers.  Pelvic exam and Pap test. ? This may be done every 3 years starting  at age 4. ? Starting at age 30, this may be done every 5 years if you have a Pap test in combination with an HPV test. Other tests  STD (sexually transmitted disease) testing, if you are at risk.  Bone density scan. This is done to screen for osteoporosis. You may have this scan if you are at high risk for osteoporosis. Talk with your health care provider about your test results, treatment options, and if necessary, the need for more  tests. Follow these instructions at home: Eating and drinking  Eat a diet that includes fresh fruits and vegetables, whole grains, lean protein, and low-fat dairy products.  Take vitamin and mineral supplements as recommended by your health care provider.  Do not drink alcohol if: ? Your health care provider tells you not to drink. ? You are pregnant, may be pregnant, or are planning to become pregnant.  If you drink alcohol: ? Limit how much you have to 0-1 drink a day. ? Be aware of how much alcohol is in your drink. In the [place] one drink equals one 12 oz bottle of beer (355 mL), one 5 oz glass of wine (148 mL), or one 1 oz glass of hard liquor (44 mL).   Lifestyle  Take daily care of your teeth and gums. Brush your teeth every morning and night with fluoride toothpaste. Floss one time each day.  Stay active. Exercise for at least 30 minutes 5 or more days each week.  Do not use any products that contain nicotine or tobacco, such as cigarettes, e-cigarettes, and chewing tobacco. If you need help quitting, ask your health care provider.  Do not use drugs.  If you are sexually active, practice safe sex. Use a condom or other form of protection to prevent STIs (sexually transmitted infections).  If you do not wish to become pregnant, use a form of birth control. If you plan to become pregnant, see your health care provider for a prepregnancy visit.  If told by your health care provider, take low-dose aspirin daily starting at age 12.  Find healthy ways to cope with stress, such as: ? Meditation, yoga, or listening to music. ? Journaling. ? Talking to a trusted person. ? Spending time with friends and family. Safety  Always wear your seat belt while driving or riding in a vehicle.  Do not drive: ? If you have been drinking alcohol. Do not ride with someone who has been drinking. ? When you are tired or distracted. ? While texting.  Wear a helmet and other protective  equipment during sports activities.  If you have firearms in your house, make sure you follow all gun safety procedures. What's next?  Visit your health care provider once a year for an annual wellness visit.  Ask your health care provider how often you should have your eyes and teeth checked.  Stay up to date on all vaccines. This information is not intended to replace advice given to you by your health care provider. Make sure you discuss any questions you have with your health care provider. Document Revised: 09/06/2020 Document Reviewed: 08/14/2018 Elsevier Patient Education  2021 [employer].

## 2021-02-15 ENCOUNTER — Encounter: Payer: Self-pay | Admitting: Medical

## 2021-02-15 MED ORDER — FLUTICASONE PROPIONATE 50 MCG/ACT NA SUSP: 2.0000 | Freq: Every day | NASAL | 1 refills | Status: AC

## 2021-02-15 MED ORDER — LEVOCETIRIZINE DIHYDROCHLORIDE 5 MG PO TABS: 5.0000 mg | ORAL_TABLET | Freq: Every evening | ORAL | 3 refills | Status: DC

## 2021-02-15 MED ORDER — AZITHROMYCIN 250 MG PO TABS: ORAL_TABLET | ORAL | 0 refills | Status: DC

## 2021-02-15 MED ORDER — MONTELUKAST SODIUM 10 MG PO TABS: 10.0000 mg | ORAL_TABLET | Freq: Every day | ORAL | 3 refills | Status: DC

## 2021-02-15 MED ORDER — BENZONATATE 100 MG PO CAPS: 100.0000 mg | ORAL_CAPSULE | Freq: Three times a day (TID) | ORAL | 0 refills | Status: DC | PRN

## 2021-02-15 NOTE — Addendum Note (Signed)
Addended by: Anabel Halon on: 02/15/2021 07:57 PM   Modules accepted: Orders

## 2021-02-16 ENCOUNTER — Encounter: Payer: Self-pay | Admitting: Gastroenterology

## 2021-02-16 LAB — QUANTIFERON-TB GOLD PLUS
Mitogen-NIL: >10 IU/mL
NIL: 0.05 IU/mL
QuantiFERON-TB Gold Plus: NEGATIVE
TB1-NIL: 0 IU/mL
TB2-NIL: 0.02 IU/mL

## 2021-02-20 ENCOUNTER — Telehealth: Payer: Self-pay | Admitting: Medical

## 2021-02-20 DIAGNOSIS — K219 Gastro-esophageal reflux disease without esophagitis: Secondary | ICD-10-CM

## 2021-02-20 DIAGNOSIS — Z6832 Body mass index (BMI) 32.0-32.9, adult: Secondary | ICD-10-CM | POA: Diagnosis not present

## 2021-02-20 DIAGNOSIS — I10 Essential (primary) hypertension: Secondary | ICD-10-CM | POA: Diagnosis not present

## 2021-02-20 NOTE — Telephone Encounter (Signed)
Referral to gi placed. For gerd/possible egd. I think I referred to [facility] both times.

## 2021-02-28 DIAGNOSIS — I10 Essential (primary) hypertension: Secondary | ICD-10-CM | POA: Diagnosis not present

## 2021-02-28 DIAGNOSIS — H35033 Hypertensive retinopathy, bilateral: Secondary | ICD-10-CM | POA: Diagnosis not present

## 2021-02-28 DIAGNOSIS — Z789 Other specified health status: Secondary | ICD-10-CM | POA: Diagnosis not present

## 2021-03-13 DIAGNOSIS — Z6832 Body mass index (BMI) 32.0-32.9, adult: Secondary | ICD-10-CM | POA: Diagnosis not present

## 2021-03-13 DIAGNOSIS — I10 Essential (primary) hypertension: Secondary | ICD-10-CM | POA: Diagnosis not present

## 2021-04-01 ENCOUNTER — Encounter: Payer: Self-pay | Admitting: Medical

## 2021-04-01 ENCOUNTER — Other Ambulatory Visit: Payer: Self-pay | Admitting: Medical

## 2021-04-03 ENCOUNTER — Other Ambulatory Visit: Payer: Self-pay

## 2021-04-03 ENCOUNTER — Ambulatory Visit (INDEPENDENT_AMBULATORY_CARE_PROVIDER_SITE_OTHER): Payer: PRIVATE HEALTH INSURANCE | Admitting: Gastroenterology

## 2021-04-03 ENCOUNTER — Encounter: Payer: Self-pay | Admitting: Gastroenterology

## 2021-04-03 VITALS — BP 128/70 | HR 71 | Ht 63.0 in | Wt 176.0 lb

## 2021-04-03 DIAGNOSIS — R140 Abdominal distension (gaseous): Secondary | ICD-10-CM

## 2021-04-03 DIAGNOSIS — K219 Gastro-esophageal reflux disease without esophagitis: Secondary | ICD-10-CM | POA: Diagnosis not present

## 2021-04-03 MED ORDER — OMEPRAZOLE 40 MG PO CPDR: 40.0000 mg | DELAYED_RELEASE_CAPSULE | Freq: Every day | ORAL | 3 refills | Status: DC

## 2021-04-03 MED ORDER — LOSARTAN POTASSIUM 25 MG PO TABS: ORAL_TABLET | ORAL | 3 refills | Status: DC

## 2021-04-03 NOTE — Patient Instructions (Addendum)
It was a pleasure to meet you today. Based on our discussion, I am providing you with my recommendations below:  RECOMMENDATION(S):   I am recommending that you begin Omeprazole daily. I have sent a prescription to your pharmacy.  PRESCRIPTION MEDICATION(S):   We have sent the following medication(s) to your pharmacy:  . Omeprazole - please take 40mg  by mouth daily  NOTE: If your medication(s) requires a PRIOR AUTHORIZATION, we will receive notification from your pharmacy. Once received, the process to submit for approval may take up to 7-10 business days. You will be contacted about any denials we have received from your insurance company as well as alternatives recommended by your provider.  ENDOSCOPY:   . You have been scheduled for an endoscopy. Please follow written instructions given to you at your visit today.  INHALERS:   . If you use inhalers (even only as needed), please bring them with you on the day of your procedure.  BMI:  . If you are age 53 or younger, your body mass index should be between 19-25. Your Body mass index is 31.18 kg/m. If this is out of the aformentioned range listed, please consider follow up with your Primary Care Provider.   Thank you for trusting me with your gastrointestinal care!    Thornton Park, MD, MPH

## 2021-04-03 NOTE — Progress Notes (Signed)
ROI FAXED - DR. HUNG  Location where fax was sent: Dr. Benson Norway  Documents Faxed: Signed ROI Confirmation received: Yes Documents and confirmation held for future reference: Yes

## 2021-04-03 NOTE — Progress Notes (Signed)
Referring Provider: Mackie Pai, PA-C Primary Care Physician:  Mackie Pai, PA-C  Reason for Consultation:  GERD   IMPRESSION:  Intermittent reflux x several months    - symptom onset over age 55 Bloating with increased eructation and intermittent nausea Recent, intentional weight loss Personal history of colon polyps  PLAN: - Trial or pantoprazole 40 mg QAM - Reviewed lifestyle modifications to minimize GERD and bloating symptoms - EGD with biopsies to exclude esophagitis, gastritis, H pylori, celiac, and even malignancy - Obtain colonoscopy and associated pathology reports from Dr. Benson Norway  Please see the "Patient Instructions" section for addition details about the plan.  HPI: [name] is a 55 y.o. female referred by PA Antelope for further evaluation of reflux. The history is obtained through the patient and review of her electronic health record. She has anxiety, hypertension, and had a hysterectomy in 2018. She works as a Conservation officer, nature.   Reports several months acid reflux described as brash and near constant bloating with associated eructation, intermittent - albeit rare -  nausea, and sense of distension. No early satiety. Has lost weight since joining a kick boxing gym 2 months ago. Temporal association of symptom exacerbation with stress of going through a separation.   Symptoms worsened by dairy and orange juice. She has not tried OTC or prescription medications for symptomatic relief. Prior smoker. No evidence for GI bleeding, iron deficiency anemia, anorexia, odynophagia, vomiting, or gastrointestinal cancer in a first-degree relative.   Mother is a CNA and has made her very concerned about her symptoms and the need for an EGD for evaluation. The patient herself does not like to take medications.   Patient reports colonoscopy 2 years ago with surveillance colonoscopy recommended in 5 years given a history of colon polyps. It appears in Epic  that she had a colonoscopy with Dr. Benson Norway 05/29/18. Neither the procedure note nor pathology results are available.   No known family history of colon cancer or polyps. No family history of uterine/endometrial cancer, pancreatic cancer or gastric/stomach cancer.  Labs 02/14/21: normal CMP and CBC except for slight increase in peripheral eosinophilia   Past Medical History:  Diagnosis Date  . Anxiety   . Fibroids    patient denies fibroids  . Fibroids    patient denies fibroids  . Hay fever    Seasonal Allergies  . Hypertension   . PONV (postoperative nausea and vomiting)     Past Surgical History:  Procedure Laterality Date  . nova sure    . ROBOTIC ASSISTED TOTAL HYSTERECTOMY WITH SALPINGECTOMY Bilateral 01/12/2016   Procedure: ROBOTIC ASSISTED TOTAL HYSTERECTOMY WITH BILATERAL SALPINGECTOMY;  Surgeon: Servando Salina, MD;  Location: [facility];  Service: Gynecology;  Laterality: Bilateral;  3 hrs.  . TUBAL LIGATION    . WISDOM TOOTH EXTRACTION      Current Outpatient Medications  Medication Sig Dispense Refill  . Ascorbic Acid (VITAMIN C) 100 MG tablet Take 100 mg by mouth daily.    . cholecalciferol (VITAMIN D3) 25 MCG (1000 UNIT) tablet Take 1,000 Units by mouth daily.    . Flaxseed, Linseed, (FLAX SEEDS PO) Take 1 tablet by mouth daily.    . fluticasone (FLONASE) 50 MCG/ACT nasal spray Place 2 sprays into both nostrils daily. 16 g 1  . levocetirizine (XYZAL) 5 MG tablet Take 1 tablet (5 mg total) by mouth every evening. 30 tablet 3  . losartan (COZAAR) 25 MG tablet TAKE 1 TABLET(25 MG) BY MOUTH DAILY 30 tablet 3  .  omeprazole (PRILOSEC) 40 MG capsule Take 1 capsule (40 mg total) by mouth daily. 90 capsule 3  . zinc gluconate 50 MG tablet Take 50 mg by mouth daily.     No current facility-administered medications for this visit.    Allergies as of 04/03/2021 - Review Complete 04/03/2021  Allergen Reaction Noted  . Codeine Other (See Comments) 02/19/2013    Family History   Problem Relation Age of Onset  . Hypertension Mother   . Angina Mother   . Fibromyalgia Mother   . Asthma Mother   . Depression Mother   . Arthritis Mother   . Diabetes Father   . Depression Brother   . Post-traumatic stress disorder Brother   . Breast cancer Maternal Aunt        Great Aunt, Maternal  . Allergies Daughter   . Pancreatic cancer Maternal Uncle    12 system review of systems: Allergy, vision changes, itching   Physical Exam: General:   Alert,  well-nourished, pleasant and cooperative in NAD Head:  Normocephalic and atraumatic. Eyes:  Sclera clear, no icterus.   Conjunctiva pink. Ears:  Normal auditory acuity. Nose:  No deformity, discharge,  or lesions. Mouth:  No deformity or lesions.   Neck:  Supple; no masses or thyromegaly. Lungs:  Clear throughout to auscultation.   No wheezes. Heart:  Regular rate and rhythm; no murmurs. Abdomen:  Soft, nontender, nondistended, normal bowel sounds, no rebound or guarding. No hepatosplenomegaly.   Rectal:  Deferred  Msk:  Symmetrical. No boney deformities LAD: No inguinal or umbilical LAD Extremities:  No clubbing or edema. Neurologic:  Alert and  oriented x4;  grossly nonfocal Skin:  Intact without significant lesions or rashes. Psych:  Alert and cooperative. Normal mood and affect.    Kurstin Dimarzo L. Tarri Glenn, MD, MPH 04/03/2021, 4:07 PM

## 2021-04-09 ENCOUNTER — Encounter: Payer: Self-pay | Admitting: Medical

## 2021-04-13 NOTE — Progress Notes (Signed)
ROI REQUEST - SECOND REQUEST  Location whereROI wasfaxed:Dr. Benson Norway Confirmation received:Yes Documents and confirmation held for future reference:Yes

## 2021-04-14 ENCOUNTER — Encounter: Payer: PRIVATE HEALTH INSURANCE | Admitting: Gastroenterology

## 2021-04-19 ENCOUNTER — Ambulatory Visit (INDEPENDENT_AMBULATORY_CARE_PROVIDER_SITE_OTHER): Payer: BC Managed Care – PPO | Admitting: Medical

## 2021-04-19 ENCOUNTER — Other Ambulatory Visit: Payer: Self-pay

## 2021-04-19 VITALS — BP 124/83 | HR 78 | Resp 18 | Ht 63.0 in | Wt 170.0 lb

## 2021-04-19 DIAGNOSIS — R519 Headache, unspecified: Secondary | ICD-10-CM | POA: Diagnosis not present

## 2021-04-19 MED ORDER — SUMATRIPTAN SUCCINATE 50 MG PO TABS: 50.0000 mg | ORAL_TABLET | ORAL | 0 refills | Status: DC | PRN

## 2021-04-19 NOTE — Progress Notes (Signed)
Subjective:    Patient ID: [name], female    DOB: January 16, 1966, 55 y.o.   MRN: [id number]  HPI  Pt in for follow evaluation.  Pt states about has occasional transient pain at top of head. Pt states last time she had this pain was in around march..  Pt states at that time she was very stressed. Pt states she think it happened one time February. Lasted about 30 minutes.   Both events when had pain on top of head was sound sensitive and wanted to sleep.  Was mild nausea with this pain. No vomiting. No light or sound sensitivity.  Pt has no daily headaches.  No family hx of any anuerysms.  Pt states the above never present. When had above 2 events no gross motor or sensory  Pt has mild ldl elevation. No known CAD. Nonsmoker. Not diabetic. Bp not elevated.  No current HA.   Review of Systems  Constitutional: Negative for chills, fatigue and fever.  HENT: Negative for congestion and ear pain.   Respiratory: Negative for cough, chest tightness, shortness of breath and wheezing.   Cardiovascular: Negative for chest pain and palpitations.  Gastrointestinal: Negative for abdominal pain.  Musculoskeletal: Negative for back pain.  Skin: Negative for rash.  Neurological: Negative for dizziness, speech difficulty, weakness, numbness and headaches.  Hematological: Negative for adenopathy. Does not bruise/bleed easily.  Psychiatric/Behavioral: Negative for behavioral problems and decreased concentration.    Past Medical History:  Diagnosis Date  . Anxiety   . Fibroids    patient denies fibroids  . Fibroids    patient denies fibroids  . Hay fever    Seasonal Allergies  . Hypertension   . PONV (postoperative nausea and vomiting)      Social History   Socioeconomic History  . Marital status: Married    Spouse name: Not on file  . Number of children: Not on file  . Years of education: Not on file  . Highest education level: Not on file  Occupational  History  . Not on file  Tobacco Use  . Smoking status: Former Smoker    Types: Cigarettes    Quit date: 1992    Years since quitting: 30.3  . Smokeless tobacco: Never Used  Substance and Sexual Activity  . Alcohol use: No  . Drug use: No  . Sexual activity: Yes    Birth control/protection: Surgical  Other Topics Concern  . Not on file  Social History Narrative  . Not on file   Social Determinants of Health   Financial Resource Strain: Not on file  Food Insecurity: Not on file  Transportation Needs: Not on file  Physical Activity: Not on file  Stress: Not on file  Social Connections: Not on file  Intimate Partner Violence: Not on file    Past Surgical History:  Procedure Laterality Date  . nova sure    . ROBOTIC ASSISTED TOTAL HYSTERECTOMY WITH SALPINGECTOMY Bilateral 01/12/2016   Procedure: ROBOTIC ASSISTED TOTAL HYSTERECTOMY WITH BILATERAL SALPINGECTOMY;  Surgeon: Servando Salina, MD;  Location: [facility];  Service: Gynecology;  Laterality: Bilateral;  3 hrs.  . TUBAL LIGATION    . WISDOM TOOTH EXTRACTION      Family History  Problem Relation Age of Onset  . Hypertension Mother   . Angina Mother   . Fibromyalgia Mother   . Asthma Mother   . Depression Mother   . Arthritis Mother   . Diabetes Father   . Depression Brother   .  Post-traumatic stress disorder Brother   . Breast cancer Maternal Aunt        Great Aunt, Maternal  . Allergies Daughter   . Pancreatic cancer Maternal Uncle     Allergies  Allergen Reactions  . Codeine Other (See Comments)    Dizziness     Current Outpatient Medications on File Prior to Visit  Medication Sig Dispense Refill  . Ascorbic Acid (VITAMIN C) 100 MG tablet Take 100 mg by mouth daily.    . cholecalciferol (VITAMIN D3) 25 MCG (1000 UNIT) tablet Take 1,000 Units by mouth daily.    . Flaxseed, Linseed, (FLAX SEEDS PO) Take 1 tablet by mouth daily.    . fluticasone (FLONASE) 50 MCG/ACT nasal spray Place 2 sprays into both  nostrils daily. 16 g 1  . levocetirizine (XYZAL) 5 MG tablet Take 1 tablet (5 mg total) by mouth every evening. 30 tablet 3  . losartan (COZAAR) 25 MG tablet TAKE 1 TABLET(25 MG) BY MOUTH DAILY 30 tablet 3  . omeprazole (PRILOSEC) 40 MG capsule Take 1 capsule (40 mg total) by mouth daily. 90 capsule 3  . zinc gluconate 50 MG tablet Take 50 mg by mouth daily.     No current facility-administered medications on file prior to visit.    BP 124/83   Pulse 78   Resp 18   Ht 5\' 3"  (1.6 m)   Wt 170 lb (77.1 kg)   LMP 12/24/2015 (Exact Date) Comment: January 2017  SpO2 100%   BMI 30.11 kg/m      Objective:   Physical Exam  General Mental Status- Alert. General Appearance- Not in acute distress.   Skin General: Color- Normal Color. Moisture- Normal Moisture.  Neck Carotid Arteries- Normal color. Moisture- Normal Moisture. No carotid bruits. No JVD.  Chest and Lung Exam Auscultation: Breath Sounds:-Normal.  Cardiovascular Auscultation:Rythm- Regular. Murmurs & Other Heart Sounds:Auscultation of the heart reveals- No Murmurs.  Abdomen Inspection:-Inspeection Normal. Palpation/Percussion:Note:No mass. Palpation and Percussion of the abdomen reveal- Non Tender, Non Distended + BS, no rebound or guarding.   Neurologic Cranial Nerve exam:- CN III-XII intact(No nystagmus), symmetric smile. Drift Test:- No drift. Romberg Exam:- Negative.  Heal to Toe Gait exam:-Normal. Finger to Nose:- Normal/Intact Strength:- 5/5 equal and symmetric strength both upper and lower extremities.      Assessment & Plan:  Described atypical head pain 2 times since March but none since.  Both events associated with transient sound sensitivity but  resolved with sleeping quickly.   These 2 events sound possibly migraine-like though intensity of the headache does not sound severe.  You could try Excedrin Migraine early on for headache.  If that is not adequate and headaches are worse then sending  Imitrex to your pharmacy.   Your neurologic exam is completely normal today.  We will need to follow you closely and see how frequent headaches are.  Also assess response to medications since headaches are new and you do have history of headaches we will proceed with caution MRI in the near future get CT of your head.  If you get severe headache with neurologic signs or symptoms as discussed then recommend ED evaluation for evaluation and possible imaging.  Asked you to follow-up in 2 to 3 weeks to see how you are doing at that time.  Mackie Pai, PA-C

## 2021-04-19 NOTE — Patient Instructions (Addendum)
Described atypical head pain 2 times since March but none since.  Both events associated with transient sound sensitivity but  resolved with sleeping quickly.   These 2 events sound possibly migraine-like though intensity of the headache does not sound severe.  You could try Excedrin Migraine early on for headache.  If that is not adequate and headaches are worse then sending Imitrex to your pharmacy.   Your neurologic exam is completely normal today.  We will need to follow you closely and see how frequent headaches are.  Also assess response to medications since headaches are new and you do have history of headaches we will proceed with caution MRI in the near future get CT of your head.  If you get severe headache with neurologic signs or symptoms as discussed then recommend ED evaluation for evaluation and possible imaging.  Asked you to follow-up in 2 to 3 weeks to see how you are doing at that time.

## 2021-04-20 NOTE — Progress Notes (Signed)
ROI REQUEST- FINAL REQUEST  Location whereROI wasfaxed:Dr. [facility] [place] Confirmation received:Yes Documents and confirmation held for future reference:Yes

## 2021-04-24 ENCOUNTER — Other Ambulatory Visit: Payer: Self-pay | Admitting: Gastroenterology

## 2021-04-24 ENCOUNTER — Encounter: Payer: Self-pay | Admitting: Gastroenterology

## 2021-04-24 ENCOUNTER — Other Ambulatory Visit: Payer: Self-pay

## 2021-04-24 ENCOUNTER — Ambulatory Visit (AMBULATORY_SURGERY_CENTER): Payer: BC Managed Care – PPO | Admitting: Gastroenterology

## 2021-04-24 VITALS — BP 126/72 | HR 73 | Temp 96.8°F | Resp 10 | Ht 63.0 in | Wt 176.0 lb

## 2021-04-24 DIAGNOSIS — B9681 Helicobacter pylori [H. pylori] as the cause of diseases classified elsewhere: Secondary | ICD-10-CM

## 2021-04-24 DIAGNOSIS — K2970 Gastritis, unspecified, without bleeding: Secondary | ICD-10-CM | POA: Diagnosis not present

## 2021-04-24 DIAGNOSIS — K219 Gastro-esophageal reflux disease without esophagitis: Secondary | ICD-10-CM

## 2021-04-24 DIAGNOSIS — K229 Disease of esophagus, unspecified: Secondary | ICD-10-CM | POA: Diagnosis not present

## 2021-04-24 DIAGNOSIS — K2950 Unspecified chronic gastritis without bleeding: Secondary | ICD-10-CM | POA: Diagnosis not present

## 2021-04-24 MED ORDER — SODIUM CHLORIDE 0.9 % IV SOLN: 500.0000 mL | Freq: Once | INTRAVENOUS | Status: DC

## 2021-04-24 MED ORDER — PANTOPRAZOLE SODIUM 40 MG PO TBEC: 40.0000 mg | DELAYED_RELEASE_TABLET | Freq: Two times a day (BID) | ORAL | 2 refills | Status: DC

## 2021-04-24 NOTE — Op Note (Signed)
[facility] Patient Name: [name] Procedure Date: 04/24/2021 10:17 AM MRN: [id number] Endoscopist: Thornton Park MD, MD Age: 55 Referring MD:  Date of Birth: 02-20-1966 Gender: Female Account #: [id number] Procedure:                Upper GI endoscopy Indications:              Suspected gastro-esophageal reflux disease,                            Abdominal bloating, Nausea Medicines:                Monitored Anesthesia Care Procedure:                Pre-Anesthesia Assessment:                           - Prior to the procedure, a History and Physical                            was performed, and patient medications and                            allergies were reviewed. The patient's tolerance of                            previous anesthesia was also reviewed. The risks                            and benefits of the procedure and the sedation                            options and risks were discussed with the patient.                            All questions were answered, and informed consent                            was obtained. Prior Anticoagulants: The patient has                            taken no previous anticoagulant or antiplatelet                            agents. ASA Grade Assessment: II - A patient with                            mild systemic disease. After reviewing the risks                            and benefits, the patient was deemed in                            satisfactory condition to undergo the procedure.  After obtaining informed consent, the endoscope was                            passed under direct vision. Throughout the                            procedure, the patient's blood pressure, pulse, and                            oxygen saturations were monitored continuously. The                            Endoscope was introduced through the mouth, and                            advanced to the third  part of duodenum. The upper                            GI endoscopy was accomplished without difficulty.                            The patient tolerated the procedure well. Scope In: Scope Out: Findings:                 LA Grade A (one or more mucosal breaks less than 5                            mm, not extending between tops of 2 mucosal folds)                            esophagitis with no bleeding was found 36 cm from                            the incisors. Biopsies were taken from the                            mid/proximal and distal esophagus with a cold                            forceps for histology. Estimated blood loss was                            minimal.                           Patchy minimal inflammation characterized by                            erythema was found in the gastric antrum. Biopsies                            were taken from the antrum, body, and fundus with a  cold forceps for histology. Estimated blood loss                            was minimal.                           Patchy mildly erythematous mucosa without active                            bleeding and with no stigmata of bleeding was found                            in the duodenal bulb. Biopsies were taken with a                            cold forceps for histology. Estimated blood loss                            was minimal. Complications:            No immediate complications. Estimated blood loss:                            Minimal. Estimated Blood Loss:     Estimated blood loss was minimal. Impression:               - LA Grade A reflux esophagitis with no bleeding.                            Biopsied.                           - Gastritis. Biopsied.                           - Erythematous duodenopathy. Biopsied. Recommendation:           - Patient has a contact number available for                            emergencies. The signs and symptoms of potential                             delayed complications were discussed with the                            patient. Return to normal activities tomorrow.                            Written discharge instructions were provided to the                            patient.                           - Resume previous diet.                           -  Continue present medications. Increase                            pantoprazole to 40 mg twice daily.                           - No aspirin, ibuprofen, naproxen, or other                            non-steroidal anti-inflammatory drugs.                           - Follow-up in the office in 4-8 weeks, earlier if                            needed. Thornton Park MD, MD 04/24/2021 10:37:45 AM This report has been signed electronically.

## 2021-04-24 NOTE — Progress Notes (Signed)
VS by CW  I have reviewed the patient's medical history in detail and updated the computerized patient record.  

## 2021-04-24 NOTE — Progress Notes (Signed)
pt tolerated well. VSS. awake and to recovery. Report given to RN. Bite block left insitu to recovery. 

## 2021-04-24 NOTE — Patient Instructions (Signed)
Handouts provided on gastritis and esophagitis.   Continue present medications. Increase pantoprazole to 40 mg twice daily.   No aspirin, ibuprofen, naproxen, or other non-steriodal anti-inflammatory drugs (NSAIDs).  Follow-up in the office in 4-8 weeks, earlier if needed.    YOU HAD AN ENDOSCOPIC PROCEDURE TODAY AT [facility]:   Refer to the procedure report that was given to you for any specific questions about what was found during the examination.  If the procedure report does not answer your questions, please call your gastroenterologist to clarify.  If you requested that your care partner not be given the details of your procedure findings, then the procedure report has been included in a sealed envelope for you to review at your convenience later.  YOU SHOULD EXPECT: Some feelings of bloating in the abdomen. Passage of more gas than usual.  Walking can help get rid of the air that was put into your GI tract during the procedure and reduce the bloating. If you had a lower endoscopy (such as a colonoscopy or flexible sigmoidoscopy) you may notice spotting of blood in your stool or on the toilet paper. If you underwent a bowel prep for your procedure, you may not have a normal bowel movement for a few days.  Please Note:  You might notice some irritation and congestion in your nose or some drainage.  This is from the oxygen used during your procedure.  There is no need for concern and it should clear up in a day or so.  SYMPTOMS TO REPORT IMMEDIATELY:   Following upper endoscopy (EGD)  Vomiting of blood or coffee ground material  New chest pain or pain under the shoulder blades  Painful or persistently difficult swallowing  New shortness of breath  Fever of 100F or higher  Black, tarry-looking stools  For urgent or emergent issues, a gastroenterologist can be reached at any hour by calling [phone]. Do not use MyChart messaging for urgent concerns.    DIET:   We do recommend a small meal at first, but then you may proceed to your regular diet.  Drink plenty of fluids but you should avoid alcoholic beverages for 24 hours.  ACTIVITY:  You should plan to take it easy for the rest of today and you should NOT DRIVE or use heavy machinery until tomorrow (because of the sedation medicines used during the test).    FOLLOW UP: Our staff will call the number listed on your records 48-72 hours following your procedure to check on you and address any questions or concerns that you may have regarding the information given to you following your procedure. If we do not reach you, we will leave a message.  We will attempt to reach you two times.  During this call, we will ask if you have developed any symptoms of COVID 19. If you develop any symptoms (ie: fever, flu-like symptoms, shortness of breath, cough etc.) before then, please call [phone].  If you test positive for Covid 19 in the 2 weeks post procedure, please call and report this information to [place].    If any biopsies were taken you will be contacted by phone or by letter within the next 1-3 weeks.  Please call us at [phone] if you have not heard about the biopsies in 3 weeks.    SIGNATURES/CONFIDENTIALITY: You and/or your care partner have signed paperwork which will be entered into your electronic medical record.  These signatures attest to the fact that that  the information above on your After Visit Summary has been reviewed and is understood.  Full responsibility of the confidentiality of this discharge information lies with you and/or your care-partner.

## 2021-04-24 NOTE — Progress Notes (Signed)
Called to room to assist during endoscopic procedure.  Patient ID and intended procedure confirmed with present staff. Received instructions for my participation in the procedure from the performing physician.  

## 2021-04-26 ENCOUNTER — Telehealth: Payer: Self-pay

## 2021-04-26 ENCOUNTER — Telehealth: Payer: Self-pay | Admitting: *Deleted

## 2021-04-26 NOTE — Telephone Encounter (Signed)
No answer, left message to call back later today, B.Darleen Moffitt RN. 

## 2021-04-26 NOTE — Telephone Encounter (Signed)
Attempted 2nd f/u phone call. No answer. Left message.  °

## 2021-05-05 ENCOUNTER — Other Ambulatory Visit: Payer: Self-pay

## 2021-05-05 ENCOUNTER — Telehealth: Payer: Self-pay

## 2021-05-05 DIAGNOSIS — A048 Other specified bacterial intestinal infections: Secondary | ICD-10-CM

## 2021-05-05 MED ORDER — BISMUTH SUBSALICYLATE 262 MG PO TABS: 1.0000 | ORAL_TABLET | Freq: Four times a day (QID) | ORAL | 0 refills | Status: AC

## 2021-05-05 MED ORDER — METRONIDAZOLE 500 MG PO TABS: 500.0000 mg | ORAL_TABLET | Freq: Four times a day (QID) | ORAL | 0 refills | Status: AC

## 2021-05-05 MED ORDER — TETRACYCLINE HCL 500 MG PO CAPS: 500.0000 mg | ORAL_CAPSULE | Freq: Four times a day (QID) | ORAL | 0 refills | Status: AC

## 2021-05-05 MED ORDER — PANTOPRAZOLE SODIUM 40 MG PO TBEC: 40.0000 mg | DELAYED_RELEASE_TABLET | Freq: Two times a day (BID) | ORAL | 0 refills | Status: DC

## 2021-05-05 NOTE — Telephone Encounter (Signed)
-----   Message from Thornton Park, MD sent at 05/04/2021  8:16 PM EDT ----- Gastric biopsy showed H. pylori gastritis.  Pylera for 10 days or Helidac for 14 days, based on insurance coverage. If neither of those is preferred, please substitute with 14 days of treatment with bismuth subsalicylate 073 mg 4 times daily daily, metronidazole 500 mg 4 times daily, tetracycline 500 mg 4 times daily, and pantoprazole 40 mg twice daily.  I recommend that the patient abstain from all alcohol while taking metronidazole.  Eight weeks after completing the treatment, an H pylori stool antigen should be performed to monitor for response to treatment.  Return to the office after completing treatment. Thank you.

## 2021-05-05 NOTE — Telephone Encounter (Signed)
Inbound call from patient returning nurse's call.

## 2021-05-05 NOTE — Telephone Encounter (Signed)
Already spoke to patient. Overlapped calls

## 2021-05-05 NOTE — Telephone Encounter (Signed)
Left message to please call back. °

## 2021-05-08 NOTE — Telephone Encounter (Signed)
Spoke with pt and let her know we have no way of knowing how long she has had the bacteria in her stomach. Discussed with her how to take the protonix along with the other medications and let her know we will let her know when she needs to come for the stool specimen.

## 2021-05-08 NOTE — Telephone Encounter (Signed)
Inbound call from patient. Have questions about how long she have had infection (if it is known). And when she is to start pantoprazole. Also have questions about the H Pylori stool antigen that she should start 8 weeks after. Best contact number [phone]

## 2021-05-09 ENCOUNTER — Telehealth: Payer: Self-pay | Admitting: Gastroenterology

## 2021-05-09 NOTE — Telephone Encounter (Signed)
Inbound call from patient requesting a call from a nurse please.  States her stool has changed color since she started the medication and wants to know if that is norma.  Please advise.

## 2021-05-09 NOTE — Telephone Encounter (Signed)
Left message for pt that it is normal for her stool to turn dark black in color due to the bismuth in the pepto tablets. Pt instructed to call back with any further questions.

## 2021-05-16 ENCOUNTER — Telehealth: Payer: Self-pay | Admitting: Gastroenterology

## 2021-05-16 NOTE — Telephone Encounter (Signed)
Pt called and stated she is having stomach pain and experiencing headache. Please give her a call. Thank you

## 2021-05-17 NOTE — Telephone Encounter (Signed)
Returned patient's call, no answer and mail box is full.

## 2021-05-19 ENCOUNTER — Telehealth: Payer: Self-pay | Admitting: Gastroenterology

## 2021-05-19 NOTE — Telephone Encounter (Signed)
Inbound call from pt stating that she has finished her antibiotics and she was suppose to call to take a stool sample. Please give her a call. Thank you

## 2021-05-19 NOTE — Telephone Encounter (Signed)
Attempted to contact the patient.  Her mailbox is full.  She is due for h pylori stool antigen on 07/14/21.  Orders are in.

## 2021-05-19 NOTE — Telephone Encounter (Signed)
Spoke with the pt and made her aware that she needs stool testing in 7/29.  The order has been entered.  The pt has been advised of the information and verbalized understanding.

## 2021-06-20 ENCOUNTER — Other Ambulatory Visit: Payer: BC Managed Care – PPO

## 2021-06-20 DIAGNOSIS — A048 Other specified bacterial intestinal infections: Secondary | ICD-10-CM | POA: Diagnosis not present

## 2021-06-21 LAB — HELICOBACTER PYLORI  SPECIAL ANTIGEN
MICRO NUMBER:: 12081799
SPECIMEN QUALITY: ADEQUATE

## 2021-07-29 ENCOUNTER — Other Ambulatory Visit: Payer: Self-pay | Admitting: Gastroenterology

## 2021-07-29 DIAGNOSIS — K219 Gastro-esophageal reflux disease without esophagitis: Secondary | ICD-10-CM

## 2021-08-04 ENCOUNTER — Other Ambulatory Visit: Payer: Self-pay | Admitting: Medical

## 2021-08-15 ENCOUNTER — Other Ambulatory Visit: Payer: Self-pay | Admitting: Obstetrics and Gynecology

## 2021-08-15 DIAGNOSIS — Z1231 Encounter for screening mammogram for malignant neoplasm of breast: Secondary | ICD-10-CM

## 2021-08-24 ENCOUNTER — Other Ambulatory Visit: Payer: Self-pay | Admitting: Medical

## 2021-08-24 DIAGNOSIS — Z1231 Encounter for screening mammogram for malignant neoplasm of breast: Secondary | ICD-10-CM

## 2021-09-07 IMAGING — MG MM DIGITAL SCREENING BILAT W/ CAD
5 series · 5 of 5 positions shown · non-contrast
Comparison: Previous exam(s).

CLINICAL DATA: Screening.

EXAM:
DIGITAL SCREENING BILATERAL MAMMOGRAM WITH CAD

[R CC]
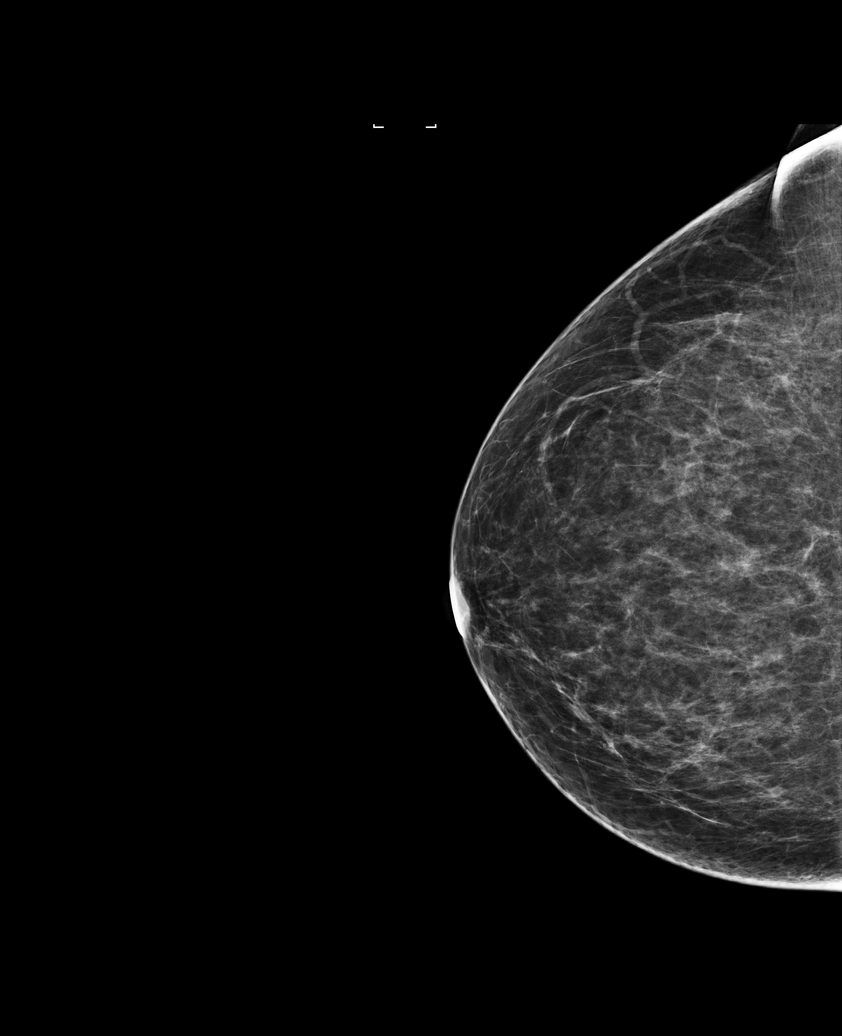

[L CC]
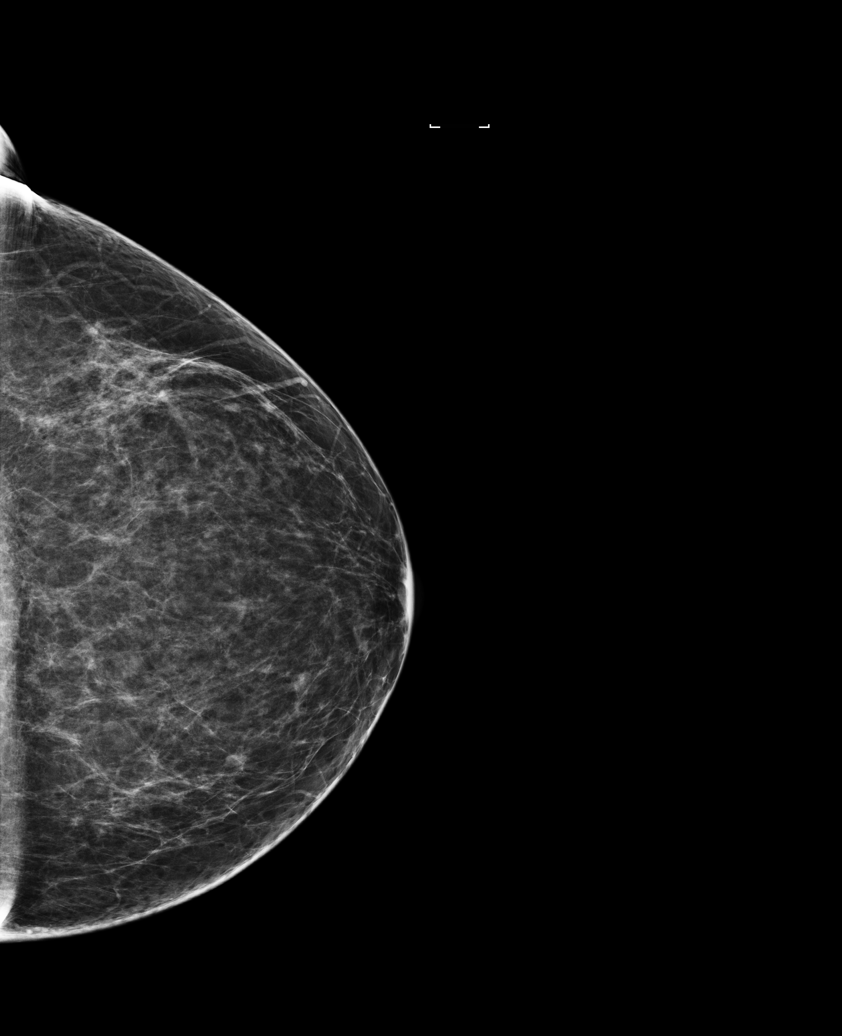

[R MLO (1 of 2)]
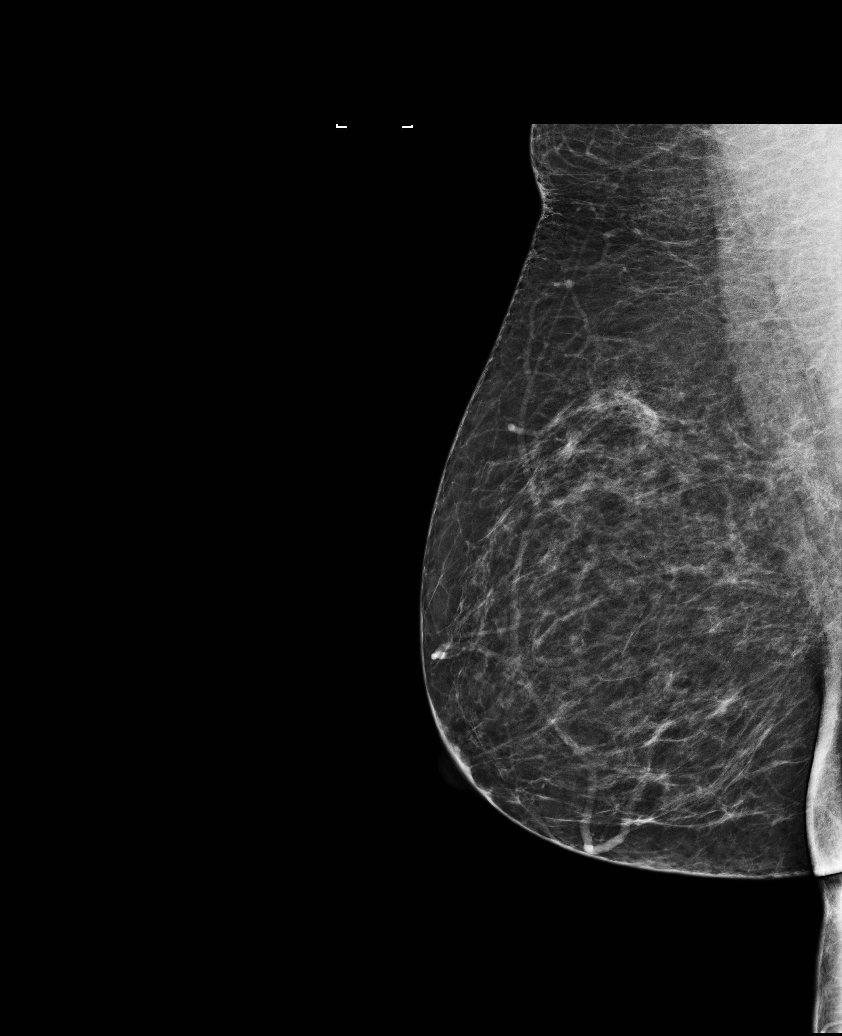

[R MLO (2 of 2)]
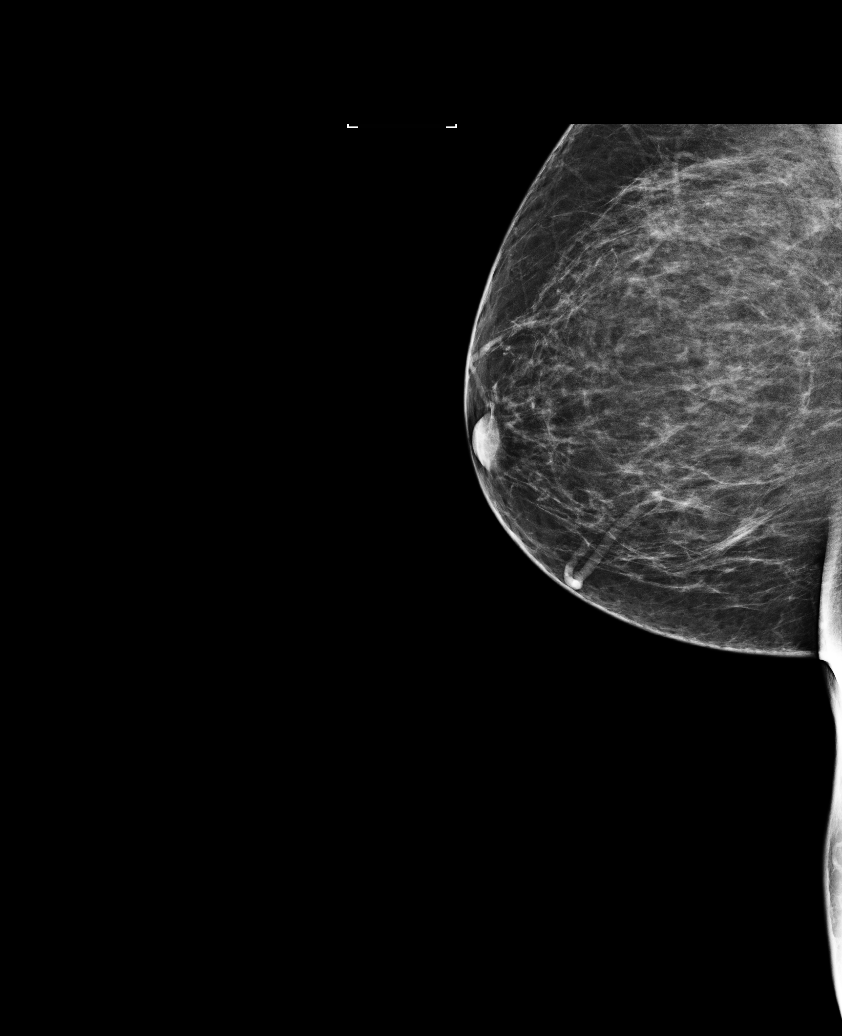

[L MLO]
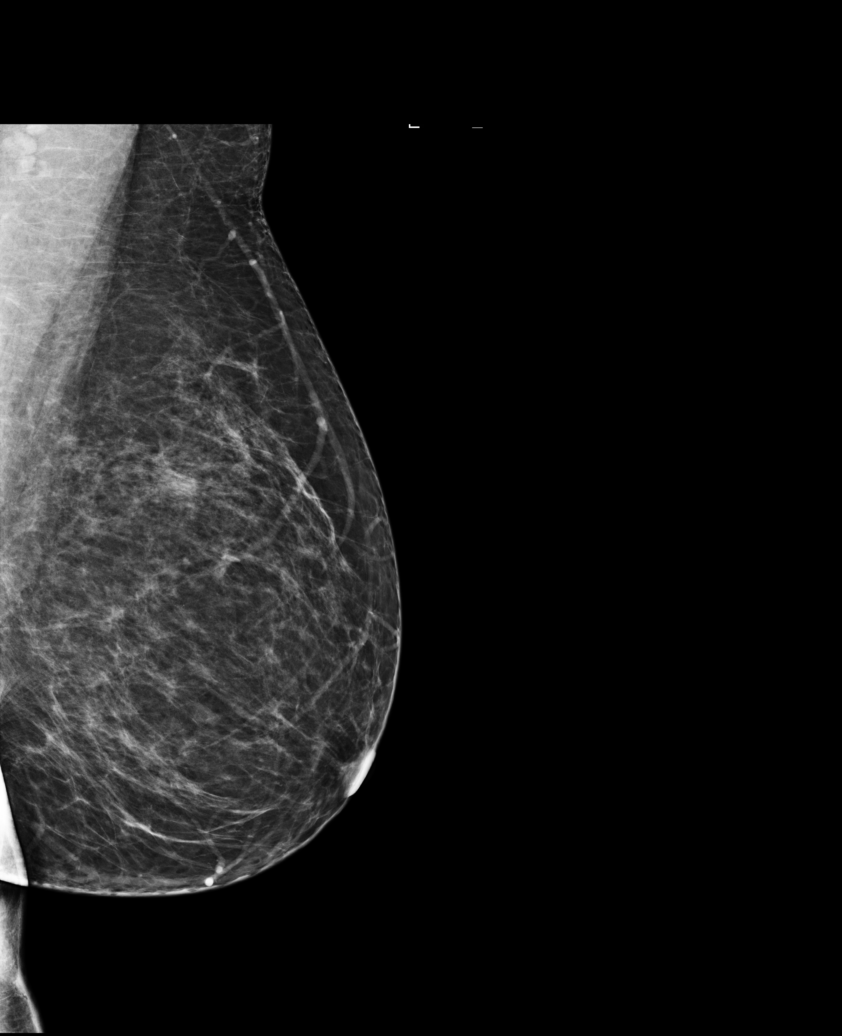

[5 of 5 positions shown; findings below may reference images not displayed]

ACR Breast Density Category b: There are scattered areas of
fibroglandular density.
FINDINGS: There are no findings suspicious for malignancy. Images were
processed with CAD.
IMPRESSION: No mammographic evidence of malignancy. A result letter of this
screening mammogram will be mailed directly to the patient.

RECOMMENDATION:
Screening mammogram in one year. (Code:AS-G-LCT)

BI-RADS CATEGORY  1: Negative.

## 2021-09-21 ENCOUNTER — Ambulatory Visit: Payer: BC Managed Care – PPO

## 2021-09-25 ENCOUNTER — Other Ambulatory Visit: Payer: Self-pay

## 2021-09-25 ENCOUNTER — Ambulatory Visit
Admission: RE | Admit: 2021-09-25 | Discharge: 2021-09-25 | Disposition: A | Discharge status: Home / Self Care | Payer: BC Managed Care – PPO | Source: Ambulatory Visit

## 2021-09-25 DIAGNOSIS — Z1231 Encounter for screening mammogram for malignant neoplasm of breast: Secondary | ICD-10-CM

## 2021-10-07 ENCOUNTER — Encounter: Payer: Self-pay | Admitting: Medical

## 2021-11-29 ENCOUNTER — Other Ambulatory Visit: Payer: Self-pay | Admitting: Medical

## 2021-12-01 DIAGNOSIS — E6609 Other obesity due to excess calories: Secondary | ICD-10-CM | POA: Diagnosis not present

## 2021-12-01 DIAGNOSIS — I10 Essential (primary) hypertension: Secondary | ICD-10-CM | POA: Diagnosis not present

## 2021-12-01 DIAGNOSIS — E559 Vitamin D deficiency, unspecified: Secondary | ICD-10-CM | POA: Diagnosis not present

## 2021-12-01 DIAGNOSIS — F4322 Adjustment disorder with anxiety: Secondary | ICD-10-CM | POA: Diagnosis not present

## 2021-12-06 DIAGNOSIS — Z01419 Encounter for gynecological examination (general) (routine) without abnormal findings: Secondary | ICD-10-CM | POA: Diagnosis not present

## 2021-12-06 DIAGNOSIS — Z90710 Acquired absence of both cervix and uterus: Secondary | ICD-10-CM | POA: Diagnosis not present

## 2021-12-06 DIAGNOSIS — R159 Full incontinence of feces: Secondary | ICD-10-CM | POA: Diagnosis not present

## 2021-12-07 ENCOUNTER — Other Ambulatory Visit: Payer: Self-pay | Admitting: Obstetrics and Gynecology

## 2021-12-07 DIAGNOSIS — E049 Nontoxic goiter, unspecified: Secondary | ICD-10-CM

## 2021-12-21 ENCOUNTER — Other Ambulatory Visit: Payer: BC Managed Care – PPO

## 2021-12-26 ENCOUNTER — Encounter: Payer: Self-pay | Admitting: Medical

## 2021-12-26 NOTE — Telephone Encounter (Signed)
Pt replied to your mychart message from 09/2021 please advise

## 2022-01-09 ENCOUNTER — Ambulatory Visit: Payer: BC Managed Care – PPO | Admitting: Medical

## 2022-01-16 ENCOUNTER — Ambulatory Visit: Payer: 59 | Admitting: Medical

## 2022-01-16 VITALS — BP 126/67 | HR 77 | Resp 18 | Ht 63.0 in | Wt 174.2 lb

## 2022-01-16 DIAGNOSIS — I10 Essential (primary) hypertension: Secondary | ICD-10-CM

## 2022-01-16 DIAGNOSIS — R739 Hyperglycemia, unspecified: Secondary | ICD-10-CM | POA: Diagnosis not present

## 2022-01-16 DIAGNOSIS — K219 Gastro-esophageal reflux disease without esophagitis: Secondary | ICD-10-CM

## 2022-01-16 DIAGNOSIS — R5383 Other fatigue: Secondary | ICD-10-CM | POA: Diagnosis not present

## 2022-01-16 DIAGNOSIS — R252 Cramp and spasm: Secondary | ICD-10-CM

## 2022-01-16 MED ORDER — MECLIZINE HCL 12.5 MG PO TABS: 12.5000 mg | ORAL_TABLET | Freq: Three times a day (TID) | ORAL | 0 refills | Status: AC | PRN

## 2022-01-16 NOTE — Patient Instructions (Addendum)
Htn- bp well controlled. Continue losartan.  Mild elevated sugar elevation in past. Will get cmp and a1c today.  Minimal ldl elevation in the past. Get lipid panel today.  For leg cramps. Will get magnesium and follow potassium level.   For occasional vertigo can use meclizine. If you get any motor or sensory deficts. Or other severe symptoms then be seen in ED.  Follow up date to be determined after lab review

## 2022-01-16 NOTE — Addendum Note (Signed)
Addended by: Anabel Halon on: 01/16/2022 05:22 PM   Modules accepted: Orders

## 2022-01-16 NOTE — Progress Notes (Signed)
Subjective:    Patient ID: [name], female    DOB: [birth year], 56 y.o.   MRN: [id number]  HPI   Pt her for follow up.   Pt had headache back in May. She tell me not having any further ha. See May note.  Htn- pt is on losartan 25 mg daily.  Pt had brief mild gerd symptoms per her report. Endoscopy and studies showed h pylori. She took antibiotics and resolved.  Rare occasional vertigo for years. Seems to get worse after she had covid in 2020. Takes meclizine if severe enough.   Random occasional leg cramping at night. Mostly at night. Not on walking. Non smoker.     Review of Systems  Constitutional:  Negative for chills, fatigue and fever.  Respiratory:  Negative for cough, chest tightness, shortness of breath and wheezing.   Cardiovascular:  Negative for chest pain and palpitations.  Gastrointestinal:  Negative for abdominal pain.  Genitourinary:  Negative for difficulty urinating and dysuria.  Musculoskeletal:  Negative for back pain.  Neurological:  Negative for dizziness, seizures, syncope, weakness, light-headedness and headaches.  Hematological:  Negative for adenopathy. Does not bruise/bleed easily.  Psychiatric/Behavioral:  Negative for behavioral problems and confusion.     Past Medical History:  Diagnosis Date   Anxiety    Fibroids    patient denies fibroids   Fibroids    patient denies fibroids   Hay fever    Seasonal Allergies   Hypertension    PONV (postoperative nausea and vomiting)      Social History   Socioeconomic History   Marital status: Married    Spouse name: Not on file   Number of children: Not on file   Years of education: Not on file   Highest education level: Not on file  Occupational History   Not on file  Tobacco Use   Smoking status: Former    Types: Cigarettes    Quit date: 42    Years since quitting: 31.1   Smokeless tobacco: Never  Substance and Sexual Activity   Alcohol use: No   Drug use:  No   Sexual activity: Yes    Birth control/protection: Surgical  Other Topics Concern   Not on file  Social History Narrative   Not on file   Social Determinants of Health   Financial Resource Strain: Not on file  Food Insecurity: Not on file  Transportation Needs: Not on file  Physical Activity: Not on file  Stress: Not on file  Social Connections: Not on file  Intimate Partner Violence: Not on file    Past Surgical History:  Procedure Laterality Date   Irving Copas sure     ROBOTIC ASSISTED TOTAL HYSTERECTOMY WITH SALPINGECTOMY Bilateral 01/12/2016   Procedure: ROBOTIC ASSISTED TOTAL HYSTERECTOMY WITH BILATERAL SALPINGECTOMY;  Surgeon: Servando Salina, MD;  Location: [facility];  Service: Gynecology;  Laterality: Bilateral;  3 hrs.   TUBAL LIGATION     WISDOM TOOTH EXTRACTION      Family History  Problem Relation Age of Onset   Hypertension Mother    Angina Mother    Fibromyalgia Mother    Asthma Mother    Depression Mother    Arthritis Mother    Diabetes Father    Depression Brother    Post-traumatic stress disorder Brother    Breast cancer Maternal Aunt        Great Aunt, Maternal   Allergies Daughter    Pancreatic cancer Maternal Uncle  Allergies  Allergen Reactions   Codeine Other (See Comments)    Dizziness     Current Outpatient Medications on File Prior to Visit  Medication Sig Dispense Refill   magnesium 30 MG tablet Take 30 mg by mouth 2 (two) times daily.     Ascorbic Acid (VITAMIN C) 100 MG tablet Take 100 mg by mouth daily.     cholecalciferol (VITAMIN D3) 25 MCG (1000 UNIT) tablet Take 1,000 Units by mouth daily.     Flaxseed, Linseed, (FLAX SEEDS PO) Take 1 tablet by mouth daily.     fluticasone (FLONASE) 50 MCG/ACT nasal spray Place 2 sprays into both nostrils daily. 16 g 1   losartan (COZAAR) 25 MG tablet TAKE 1 TABLET(25 MG) BY MOUTH DAILY 30 tablet 3   zinc gluconate 50 MG tablet Take 50 mg by mouth daily.     No current facility-administered  medications on file prior to visit.    BP 126/67    Pulse 77    Resp 18    Ht 5\' 3"  (1.6 m)    Wt 174 lb 3.2 oz (79 kg)    LMP 12/24/2015 (Exact Date) Comment: January 2017   SpO2 100%    BMI 30.86 kg/m       Objective:   Physical Exam  General Mental Status- Alert. General Appearance- Not in acute distress.   Skin General: Color- Normal Color. Moisture- Normal Moisture.  Neck Carotid Arteries- Normal color. Moisture- Normal Moisture. No carotid bruits. No JVD.  Chest and Lung Exam Auscultation: Breath Sounds:-Normal.  Cardiovascular Auscultation:Rythm- Regular. Murmurs & Other Heart Sounds:Auscultation of the heart reveals- No Murmurs.  Abdomen Inspection:-Inspeection Normal. Palpation/Percussion:Note:No mass. Palpation and Percussion of the abdomen reveal- Non Tender, Non Distended + BS, no rebound or guarding.   Neurologic Cranial Nerve exam:- CN III-XII intact(No nystagmus), symmetric smile. Strength:- 5/5 equal and symmetric strength both upper and lower extremities.   Lower ext- normal pulse on feet.    Assessment & Plan:   Patient Instructions  Htn- bp well controlled. Continue losartan.  Mild elevated sugar elevation in past. Will get cmp and a1c today.  Minimal ldl elevation in the past. Get lipid panel today.  For leg cramps. Will get magnesium and follow potassium level.   Follow up date to be determined after lab review   Mackie Pai, PA-C

## 2022-01-17 LAB — COMPREHENSIVE METABOLIC PANEL
ALT: 18 U/L (ref 0–35)
AST: 17 U/L (ref 0–37)
Albumin: 4.1 g/dL (ref 3.5–5.2)
Alkaline Phosphatase: 57 U/L (ref 39–117)
BUN: 25 mg/dL — ABNORMAL HIGH (ref 6–23)
CO2: 30 mEq/L (ref 19–32)
Calcium: 9.6 mg/dL (ref 8.4–10.5)
Chloride: 104 mEq/L (ref 96–112)
Creatinine, Ser: 0.96 mg/dL (ref 0.40–1.20)
GFR: 67.44 mL/min (ref >60.00)
Glucose, Bld: 88 mg/dL (ref 70–99)
Potassium: 4.1 mEq/L (ref 3.5–5.1)
Sodium: 139 mEq/L (ref 135–145)
Total Bilirubin: 0.4 mg/dL (ref 0.2–1.2)
Total Protein: 7.2 g/dL (ref 6.0–8.3)

## 2022-01-17 LAB — LIPID PANEL
Cholesterol: 165 mg/dL (ref 0–200)
HDL: 58 mg/dL (ref >39.00)
LDL Cholesterol: 95 mg/dL (ref 0–99)
NonHDL: 106.54
Total CHOL/HDL Ratio: 3
Triglycerides: 58 mg/dL (ref 0.0–149.0)
VLDL: 11.6 mg/dL (ref 0.0–40.0)

## 2022-01-17 LAB — CBC WITH DIFFERENTIAL/PLATELET
Basophils Absolute: 0 10*3/uL (ref 0.0–0.1)
Basophils Relative: 0.8 % (ref 0.0–3.0)
Eosinophils Absolute: 0.1 10*3/uL (ref 0.0–0.7)
Eosinophils Relative: 2 % (ref 0.0–5.0)
HCT: 36.4 % (ref 36.0–46.0)
Hemoglobin: 12 g/dL (ref 12.0–15.0)
Lymphocytes Relative: 40 % (ref 12.0–46.0)
Lymphs Abs: 1.9 10*3/uL (ref 0.7–4.0)
MCHC: 32.9 g/dL (ref 30.0–36.0)
MCV: 84.4 fl (ref 78.0–100.0)
Monocytes Absolute: 0.4 10*3/uL (ref 0.1–1.0)
Monocytes Relative: 7.7 % (ref 3.0–12.0)
Neutro Abs: 2.3 10*3/uL (ref 1.4–7.7)
Neutrophils Relative %: 49.5 % (ref 43.0–77.0)
Platelets: 225 10*3/uL (ref 150.0–400.0)
RBC: 4.31 Mil/uL (ref 3.87–5.11)
RDW: 13.8 % (ref 11.5–15.5)
WBC: 4.6 10*3/uL (ref 4.0–10.5)

## 2022-01-17 LAB — VITAMIN B12: Vitamin B-12: 1142 pg/mL — ABNORMAL HIGH (ref 211–911)

## 2022-01-17 LAB — HEMOGLOBIN A1C: Hgb A1c MFr Bld: 6 % (ref 4.6–6.5)

## 2022-01-17 LAB — MAGNESIUM: Magnesium: 2 mg/dL (ref 1.5–2.5)

## 2022-01-19 LAB — VITAMIN D 1,25 DIHYDROXY
Vitamin D 1, 25 (OH)2 Total: 57 pg/mL (ref 18–72)
Vitamin D2 1, 25 (OH)2: <8 pg/mL
Vitamin D3 1, 25 (OH)2: 57 pg/mL

## 2022-02-05 ENCOUNTER — Telehealth (INDEPENDENT_AMBULATORY_CARE_PROVIDER_SITE_OTHER): Payer: 59 | Admitting: Medical

## 2022-02-05 ENCOUNTER — Encounter: Payer: Self-pay | Admitting: Medical

## 2022-02-05 DIAGNOSIS — J309 Allergic rhinitis, unspecified: Secondary | ICD-10-CM

## 2022-02-05 DIAGNOSIS — J3489 Other specified disorders of nose and nasal sinuses: Secondary | ICD-10-CM | POA: Diagnosis not present

## 2022-02-05 DIAGNOSIS — R0982 Postnasal drip: Secondary | ICD-10-CM | POA: Diagnosis not present

## 2022-02-05 MED ORDER — AZITHROMYCIN 250 MG PO TABS: ORAL_TABLET | ORAL | 0 refills | Status: AC

## 2022-02-05 NOTE — Patient Instructions (Signed)
2 weeks of allergic rhinitis type signs and symptoms.  Some improvement with Xyzal alone.  Recommend that you add on Flonase nasal spray.  Rx for Flonase was sent to your pharmacy.  As the pollen season starts recommend also irrigating nose nightly with nasal saline.  You have mentioned on my chart message that you want antibiotic.  I did send azithromycin to your pharmacy but presently recommend seeing how you do with the above over the next 4 days and start azithromycin if more sinus symptoms occur as we discussed.  Follow-up in 10 to 14 days or sooner if needed.

## 2022-02-05 NOTE — Progress Notes (Signed)
° °  Subjective:    Patient ID: [name], female    DOB: [birth year], 56 y.o.   MRN: [id number]  HPI  Virtual Visit via Video Note  I connected with [name] on 02/05/22 at  8:40 AM EST by a video enabled telemedicine application and verified that I am speaking with the correct person using two identifiers.  Location: Patient: home Provider: office   I discussed the limitations of evaluation and management by telemedicine and the availability of in person appointments. The patient expressed understanding and agreed to proceed.  History of Present Illness: Pt had about 2 weeks of nasal congestion, pnd, sneezing, sinus pressure(at night) and st. Pt has been taking xyzal and it helps. Pt not using nasal spray.  Pt had sent my chart message asking for antibiotics.   Observations/Objective:  General-no acute distress, pleasant, oriented. Lungs- on inspection lungs appear unlabored. Neck- no tracheal deviation or jvd on inspection. Neuro- gross motor function appears intact.   Assessment and Plan:  Patient Instructions  2 weeks of allergic rhinitis type signs and symptoms.  Some improvement with Xyzal alone.  Recommend that you add on Flonase nasal spray.  Rx for Flonase was sent to your pharmacy.  As the pollen season starts recommend also irrigating nose nightly with nasal saline.  You have mentioned on my chart message that you want antibiotic.  I did send azithromycin to your pharmacy but presently recommend seeing how you do with the above over the next 4 days and start azithromycin if more sinus symptoms occur as we discussed.  Follow-up in 10 to 14 days or sooner if needed.   Time spent with patient today was 12  minutes which consisted of chart revdiew, discussing diagnosis, work up treatment and documentation.  Follow Up Instructions:    I discussed the assessment and treatment plan with the patient. The patient was provided an  opportunity to ask questions and all were answered. The patient agreed with the plan and demonstrated an understanding of the instructions.   The patient was advised to call back or seek an in-person evaluation if the symptoms worsen or if the condition fails to improve as anticipated.     Mackie Pai, PA-C   Review of Systems  Constitutional:  Negative for fatigue and fever.  HENT:  Positive for congestion, postnasal drip, sinus pressure, sneezing and sore throat.   Respiratory:  Negative for cough, choking, shortness of breath and wheezing.   Cardiovascular:  Negative for chest pain and palpitations.      Objective:   Physical Exam        Assessment & Plan:

## 2022-02-05 NOTE — Telephone Encounter (Signed)
Pt put in 840 slot , wants a call after 3:30 or either at 12

## 2022-03-03 ENCOUNTER — Other Ambulatory Visit: Payer: Self-pay | Admitting: Medical

## 2022-03-12 DIAGNOSIS — E668 Other obesity: Secondary | ICD-10-CM | POA: Diagnosis not present

## 2022-03-12 DIAGNOSIS — E1142 Type 2 diabetes mellitus with diabetic polyneuropathy: Secondary | ICD-10-CM | POA: Diagnosis not present

## 2022-03-12 DIAGNOSIS — G5603 Carpal tunnel syndrome, bilateral upper limbs: Secondary | ICD-10-CM | POA: Diagnosis not present

## 2022-03-12 DIAGNOSIS — G4733 Obstructive sleep apnea (adult) (pediatric): Secondary | ICD-10-CM | POA: Diagnosis not present

## 2022-03-14 DIAGNOSIS — F4322 Adjustment disorder with anxiety: Secondary | ICD-10-CM | POA: Diagnosis not present

## 2022-03-14 DIAGNOSIS — Z0000 Encounter for general adult medical examination without abnormal findings: Secondary | ICD-10-CM | POA: Diagnosis not present

## 2022-03-14 DIAGNOSIS — I10 Essential (primary) hypertension: Secondary | ICD-10-CM | POA: Diagnosis not present

## 2022-03-14 DIAGNOSIS — E041 Nontoxic single thyroid nodule: Secondary | ICD-10-CM | POA: Diagnosis not present

## 2022-03-14 DIAGNOSIS — G44229 Chronic tension-type headache, not intractable: Secondary | ICD-10-CM | POA: Diagnosis not present

## 2022-03-21 ENCOUNTER — Encounter (HOSPITAL_BASED_OUTPATIENT_CLINIC_OR_DEPARTMENT_OTHER): Payer: Self-pay

## 2022-03-21 ENCOUNTER — Other Ambulatory Visit: Payer: Self-pay | Admitting: Medical

## 2022-03-21 DIAGNOSIS — G4700 Insomnia, unspecified: Secondary | ICD-10-CM

## 2022-03-21 DIAGNOSIS — R0683 Snoring: Secondary | ICD-10-CM

## 2022-03-30 DIAGNOSIS — E041 Nontoxic single thyroid nodule: Secondary | ICD-10-CM | POA: Diagnosis not present

## 2022-04-09 DIAGNOSIS — R159 Full incontinence of feces: Secondary | ICD-10-CM | POA: Diagnosis not present

## 2022-04-09 DIAGNOSIS — R150 Incomplete defecation: Secondary | ICD-10-CM | POA: Diagnosis not present

## 2022-04-11 ENCOUNTER — Ambulatory Visit (HOSPITAL_BASED_OUTPATIENT_CLINIC_OR_DEPARTMENT_OTHER): Payer: 59 | Attending: Neurology | Admitting: Neurology

## 2022-04-11 DIAGNOSIS — G4700 Insomnia, unspecified: Secondary | ICD-10-CM | POA: Insufficient documentation

## 2022-04-11 DIAGNOSIS — R0683 Snoring: Secondary | ICD-10-CM | POA: Diagnosis present

## 2022-05-07 NOTE — Procedures (Signed)
    [place] A. Merlene Laughter, MD     [email or web address]             HOME SLEEP TEST  LOCATION: [facility]  Patient Name: [name], [name] Date: 04/11/2022 Gender: Female D.O.B: [birth year] Age (years): 37 Referring Provider: Phillips Odor MD, ABSM Height (inches): 63 Interpreting Physician: Phillips Odor MD, ABSM Weight (lbs): 177 RPSGT: Jacolyn Reedy BMI: 31 MRN: [id number] Neck Size: 14.00 CLINICAL INFORMATION Sleep Study Type: HST     Indication for sleep study: N/A     Epworth Sleepiness Score: N/A  SLEEP STUDY TECHNIQUE A multi-channel overnight portable sleep study was performed. The channels recorded were: nasal airflow, thoracic respiratory movement, and oxygen saturation with a pulse oximetry. Snoring was also monitored.  MEDICATIONS Patient self administered medications include: N/A.  Current Outpatient Medications:    Ascorbic Acid (VITAMIN C) 100 MG tablet, Take 100 mg by mouth daily., Disp: , Rfl:    cholecalciferol (VITAMIN D3) 25 MCG (1000 UNIT) tablet, Take 1,000 Units by mouth daily., Disp: , Rfl:    Flaxseed, Linseed, (FLAX SEEDS PO), Take 1 tablet by mouth daily., Disp: , Rfl:    fluticasone (FLONASE) 50 MCG/ACT nasal spray, Place 2 sprays into both nostrils daily., Disp: 16 g, Rfl: 1   losartan (COZAAR) 25 MG tablet, TAKE 1 TABLET(25 MG) BY MOUTH DAILY, Disp: 30 tablet, Rfl: 3   magnesium 30 MG tablet, Take 30 mg by mouth 2 (two) times daily., Disp: , Rfl:    meclizine (ANTIVERT) 12.5 MG tablet, Take 1 tablet (12.5 mg total) by mouth 3 (three) times daily as needed for dizziness., Disp: 30 tablet, Rfl: 0   zinc gluconate 50 MG tablet, Take 50 mg by mouth daily., Disp: , Rfl:    SLEEP ARCHITECTURE Patient was studied for 376.4 minutes. The sleep efficiency was 100.0 % and the patient was supine for 0.5%. The arousal index was 0.0 per hour.  RESPIRATORY PARAMETERS The overall AHI was 12.8 per hour, with a  central apnea index of 0 per hour.  The oxygen nadir was 90% during sleep.     CARDIAC DATA Mean heart rate during sleep was 73.2 bpm.  IMPRESSIONS Mild obstructive sleep apnea occurred during this study (AHI = 12.8/h). Autopap 8-14 is recommended.    Delano Metz, MD Diplomate, American Board of Sleep Medicine.  ELECTRONICALLY SIGNED ON:  05/07/2022, 10:33 AM [facility] PH: [phone]   FX: [phone] [place]

## 2022-05-30 ENCOUNTER — Other Ambulatory Visit: Payer: Self-pay | Admitting: Medical

## 2022-07-16 DIAGNOSIS — R102 Pelvic and perineal pain: Secondary | ICD-10-CM | POA: Diagnosis not present

## 2022-07-16 DIAGNOSIS — M545 Low back pain, unspecified: Secondary | ICD-10-CM | POA: Diagnosis not present

## 2022-07-16 DIAGNOSIS — N898 Other specified noninflammatory disorders of vagina: Secondary | ICD-10-CM | POA: Diagnosis not present

## 2022-07-17 DIAGNOSIS — N898 Other specified noninflammatory disorders of vagina: Secondary | ICD-10-CM | POA: Diagnosis not present

## 2022-07-31 DIAGNOSIS — R102 Pelvic and perineal pain: Secondary | ICD-10-CM | POA: Diagnosis not present

## 2022-07-31 DIAGNOSIS — Z90710 Acquired absence of both cervix and uterus: Secondary | ICD-10-CM | POA: Diagnosis not present

## 2022-10-01 ENCOUNTER — Other Ambulatory Visit: Payer: Self-pay | Admitting: Internal Medicine

## 2022-10-01 DIAGNOSIS — Z1231 Encounter for screening mammogram for malignant neoplasm of breast: Secondary | ICD-10-CM

## 2022-11-13 ENCOUNTER — Ambulatory Visit: Payer: Self-pay

## 2022-11-20 DIAGNOSIS — Z1231 Encounter for screening mammogram for malignant neoplasm of breast: Secondary | ICD-10-CM | POA: Diagnosis not present

## 2022-11-29 ENCOUNTER — Encounter: Payer: Self-pay | Admitting: Internal Medicine

## 2022-11-29 DIAGNOSIS — R921 Mammographic calcification found on diagnostic imaging of breast: Secondary | ICD-10-CM | POA: Diagnosis not present

## 2022-12-05 ENCOUNTER — Other Ambulatory Visit: Payer: Self-pay | Admitting: Internal Medicine

## 2022-12-05 DIAGNOSIS — R921 Mammographic calcification found on diagnostic imaging of breast: Secondary | ICD-10-CM

## 2022-12-21 ENCOUNTER — Ambulatory Visit
Admission: RE | Admit: 2022-12-21 | Discharge: 2022-12-21 | Disposition: A | Discharge status: Home / Self Care | Payer: BLUE CROSS/BLUE SHIELD | Source: Ambulatory Visit | Attending: Internal Medicine | Admitting: Internal Medicine

## 2022-12-21 ENCOUNTER — Ambulatory Visit
Admission: RE | Admit: 2022-12-21 | Discharge: 2022-12-21 | Disposition: A | Discharge status: Home / Self Care | Payer: Self-pay | Source: Ambulatory Visit | Attending: Internal Medicine | Admitting: Internal Medicine

## 2022-12-21 DIAGNOSIS — N6012 Diffuse cystic mastopathy of left breast: Secondary | ICD-10-CM | POA: Diagnosis not present

## 2022-12-21 DIAGNOSIS — R921 Mammographic calcification found on diagnostic imaging of breast: Secondary | ICD-10-CM

## 2022-12-21 HISTORY — PX: BREAST BIOPSY: SHX20

## 2022-12-24 ENCOUNTER — Telehealth: Payer: Self-pay | Admitting: Medical

## 2022-12-24 NOTE — Telephone Encounter (Signed)
Patient called to request her immunization records. She would like them emailed to [email or web address] but if they cannot be emailed, please call her and she will come pick up.

## 2022-12-24 NOTE — Telephone Encounter (Signed)
Immunizations sent

## 2023-01-07 ENCOUNTER — Ambulatory Visit: Payer: Self-pay

## 2023-01-23 ENCOUNTER — Ambulatory Visit (INDEPENDENT_AMBULATORY_CARE_PROVIDER_SITE_OTHER): Payer: BC Managed Care – PPO | Admitting: Gastroenterology

## 2023-01-23 ENCOUNTER — Encounter: Payer: Self-pay | Admitting: Gastroenterology

## 2023-01-23 VITALS — BP 120/76 | HR 87 | Ht 63.0 in | Wt 185.0 lb

## 2023-01-23 DIAGNOSIS — R140 Abdominal distension (gaseous): Secondary | ICD-10-CM

## 2023-01-23 DIAGNOSIS — Z86010 Personal history of colonic polyps: Secondary | ICD-10-CM | POA: Diagnosis not present

## 2023-01-23 DIAGNOSIS — Z8619 Personal history of other infectious and parasitic diseases: Secondary | ICD-10-CM

## 2023-01-23 DIAGNOSIS — R109 Unspecified abdominal pain: Secondary | ICD-10-CM

## 2023-01-23 MED ORDER — NA SULFATE-K SULFATE-MG SULF 17.5-3.13-1.6 GM/177ML PO SOLN: 1.0000 | Freq: Once | ORAL | 0 refills | Status: AC

## 2023-01-23 NOTE — Progress Notes (Signed)
Referring Provider: Mackie Pai, PA-C Primary Care Physician:  Charlane Ferretti, MD  Chief Complaint: Gas, abdominal pain  IMPRESSION:  Midabdominal pain Bloating with increased eructation and intermittent nausea History of H pylori gastritis Personal history of colon polyps  PLAN: - Increase omeprazole to 40 mg BID - Reviewed lifestyle modifications to minimize GERD and bloating symptoms - EGD with biopsies to exclude esophagitis, gastritis, H pylori, celiac, and even malignancy - Surveillance colonoscopy - Low threshold to proceed with cross-sectional imaging if endoscopic evaluation is negative  Please see the "Patient Instructions" section for addition details about the plan.  HPI: [name] is a 57 y.o. female who presents today with concerns for malodorous gas.  I last saw the patient in 2022 for reflux, nausea, and bloating.  EGD at that time diagnosed H. pylori gastritis.  She was treated with tetracycline, metronidazole, bismuth subsalicylate, and omeprazole.  H. pylori stool antigen was negative after completing treatment.  The interval history is obtained through the patient and review of her electronic health record. She has anxiety, hypertension, and had a hysterectomy in 2018.  Present today with wwo months of abdominal pain with gas and bloating. Some associated constipation but she attributes to drinking less water than she should. Appetite fluctuates. Weight has increased unintentionally. Stools are slightly pink when she wipes, but she denies melena or hematochezia.  Reports escalating symptoms with temporal associated of psychosocial stressors and depression.   Omeprazole has made the malodorous gas less foul smelling but she continues to have pain. Symptoms are not the same as in 2021.   She has been exercising in from of the TV and tries to stay active.   Evaluated by GYN last year and had a pelvic ultrasound and was told this was not the cause of  her symptoms.   Labs 01/16/2022 showing normal CMP except for BUN of 25 and normal CBC  No prior abdominal imaging  No known family history of colon cancer or polyps. No family history of uterine/endometrial cancer, pancreatic cancer or gastric/stomach cancer.  Endoscopic history: - Patient reports colonoscopy 5 years ago with surveillance colonoscopy recommended in 5 years given a history of colon polyps. It appears in Epic that she had a colonoscopy with Dr. Benson Norway 05/29/18. Neither the procedure note nor pathology results are available.  - EGD 04/24/21 for reflux, bloating, and nausea: LA grade a reflux esophagitis, gastritis, and duodenitis.  Biopsies confirmed H. pylori gastritis.  Esophageal and duodenal biopsies were normal.  Past Medical History:  Diagnosis Date   Anxiety    Fibroids    patient denies fibroids   Fibroids    patient denies fibroids   Hay fever    Seasonal Allergies   Hypertension    PONV (postoperative nausea and vomiting)     Past Surgical History:  Procedure Laterality Date   BREAST BIOPSY Left 12/21/2022   MM LT BREAST BX W LOC DEV 1ST LESION IMAGE BX SPEC STEREO GUIDE 12/21/2022 [facility]   nova sure     ROBOTIC ASSISTED TOTAL HYSTERECTOMY WITH SALPINGECTOMY Bilateral 01/12/2016   Procedure: ROBOTIC ASSISTED TOTAL HYSTERECTOMY WITH BILATERAL SALPINGECTOMY;  Surgeon: Servando Salina, MD;  Location: [facility];  Service: Gynecology;  Laterality: Bilateral;  3 hrs.   TUBAL LIGATION     WISDOM TOOTH EXTRACTION      Current Outpatient Medications  Medication Sig Dispense Refill   BLACK CURRANT SEED OIL PO Take 1 tablet by mouth daily.     omeprazole (  PRILOSEC) 20 MG capsule Take 20 mg by mouth daily.     Ascorbic Acid (VITAMIN C) 100 MG tablet Take 100 mg by mouth daily. (Patient not taking: Reported on 01/23/2023)     cholecalciferol (VITAMIN D3) 25 MCG (1000 UNIT) tablet Take 1,000 Units by mouth daily.     Flaxseed, Linseed, (FLAX SEEDS PO) Take 1 tablet  by mouth daily. (Patient not taking: Reported on 01/23/2023)     fluticasone (FLONASE) 50 MCG/ACT nasal spray Place 2 sprays into both nostrils daily. 16 g 1   losartan (COZAAR) 25 MG tablet TAKE 1 TABLET(25 MG) BY MOUTH DAILY 30 tablet 3   magnesium 30 MG tablet Take 30 mg by mouth 2 (two) times daily.     meclizine (ANTIVERT) 12.5 MG tablet Take 1 tablet (12.5 mg total) by mouth 3 (three) times daily as needed for dizziness. 30 tablet 0   zinc gluconate 50 MG tablet Take 50 mg by mouth daily.     No current facility-administered medications for this visit.    Allergies as of 01/23/2023 - Review Complete 01/23/2023  Allergen Reaction Noted   Codeine Other (See Comments) 02/19/2013     Physical Exam: General:   Alert,  well-nourished, pleasant and cooperative in NAD Head:  Normocephalic and atraumatic. Eyes:  Sclera clear, no icterus.   Conjunctiva pink. Ears:  Normal auditory acuity. Nose:  No deformity, discharge,  or lesions. Mouth:  No deformity or lesions.   Neck:  Supple; no masses or thyromegaly. Lungs:  Clear throughout to auscultation.   No wheezes. Heart:  Regular rate and rhythm; no murmurs. Abdomen:  Soft, nontender, nondistended, normal bowel sounds, no rebound or guarding. No hepatosplenomegaly.   Rectal:  Deferred  Msk:  Symmetrical. No boney deformities LAD: No inguinal or umbilical LAD Extremities:  No clubbing or edema. Neurologic:  Alert and  oriented x4;  grossly nonfocal Skin:  Intact without significant lesions or rashes. Psych:  Alert and cooperative. Normal mood and affect.    Alisha Bacus L. Tarri Glenn, MD, MPH 01/27/2023, 8:44 PM

## 2023-01-23 NOTE — Patient Instructions (Addendum)
It was a pleasure to see you today.  We increased your omeprazole to 40 mg twice daily.  We planned a colonoscopy and EGD. _______________________________________________________  If your blood pressure at your visit was 140/90 or greater, please contact your primary care physician to follow up on this.  _______________________________________________________  If you are age 55 or older, your body mass index should be between 23-30. Your Body mass index is 32.77 kg/m. If this is out of the aforementioned range listed, please consider follow up with your Primary Care Provider.  If you are age 21 or younger, your body mass index should be between 19-25. Your Body mass index is 32.77 kg/m. If this is out of the aformentioned range listed, please consider follow up with your Primary Care Provider.   ________________________________________________________  The [facility] providers would like to encourage you to use [facility] to communicate with providers for non-urgent requests or questions.  Due to long hold times on the telephone, sending your provider a message by [facility] may be a faster and more efficient way to get a response.  Please allow 48 business hours for a response.  Please remember that this is for non-urgent requests.  _______________________________________________________

## 2023-02-15 IMAGING — DX DG HUMERUS 2V *L*
2 series · 2 of 2 positions shown · non-contrast
Comparison: None.

CLINICAL DATA: Left arm pain for several months without known
injury.

EXAM:
LEFT HUMERUS - 2+ VIEW

[humerus ap]
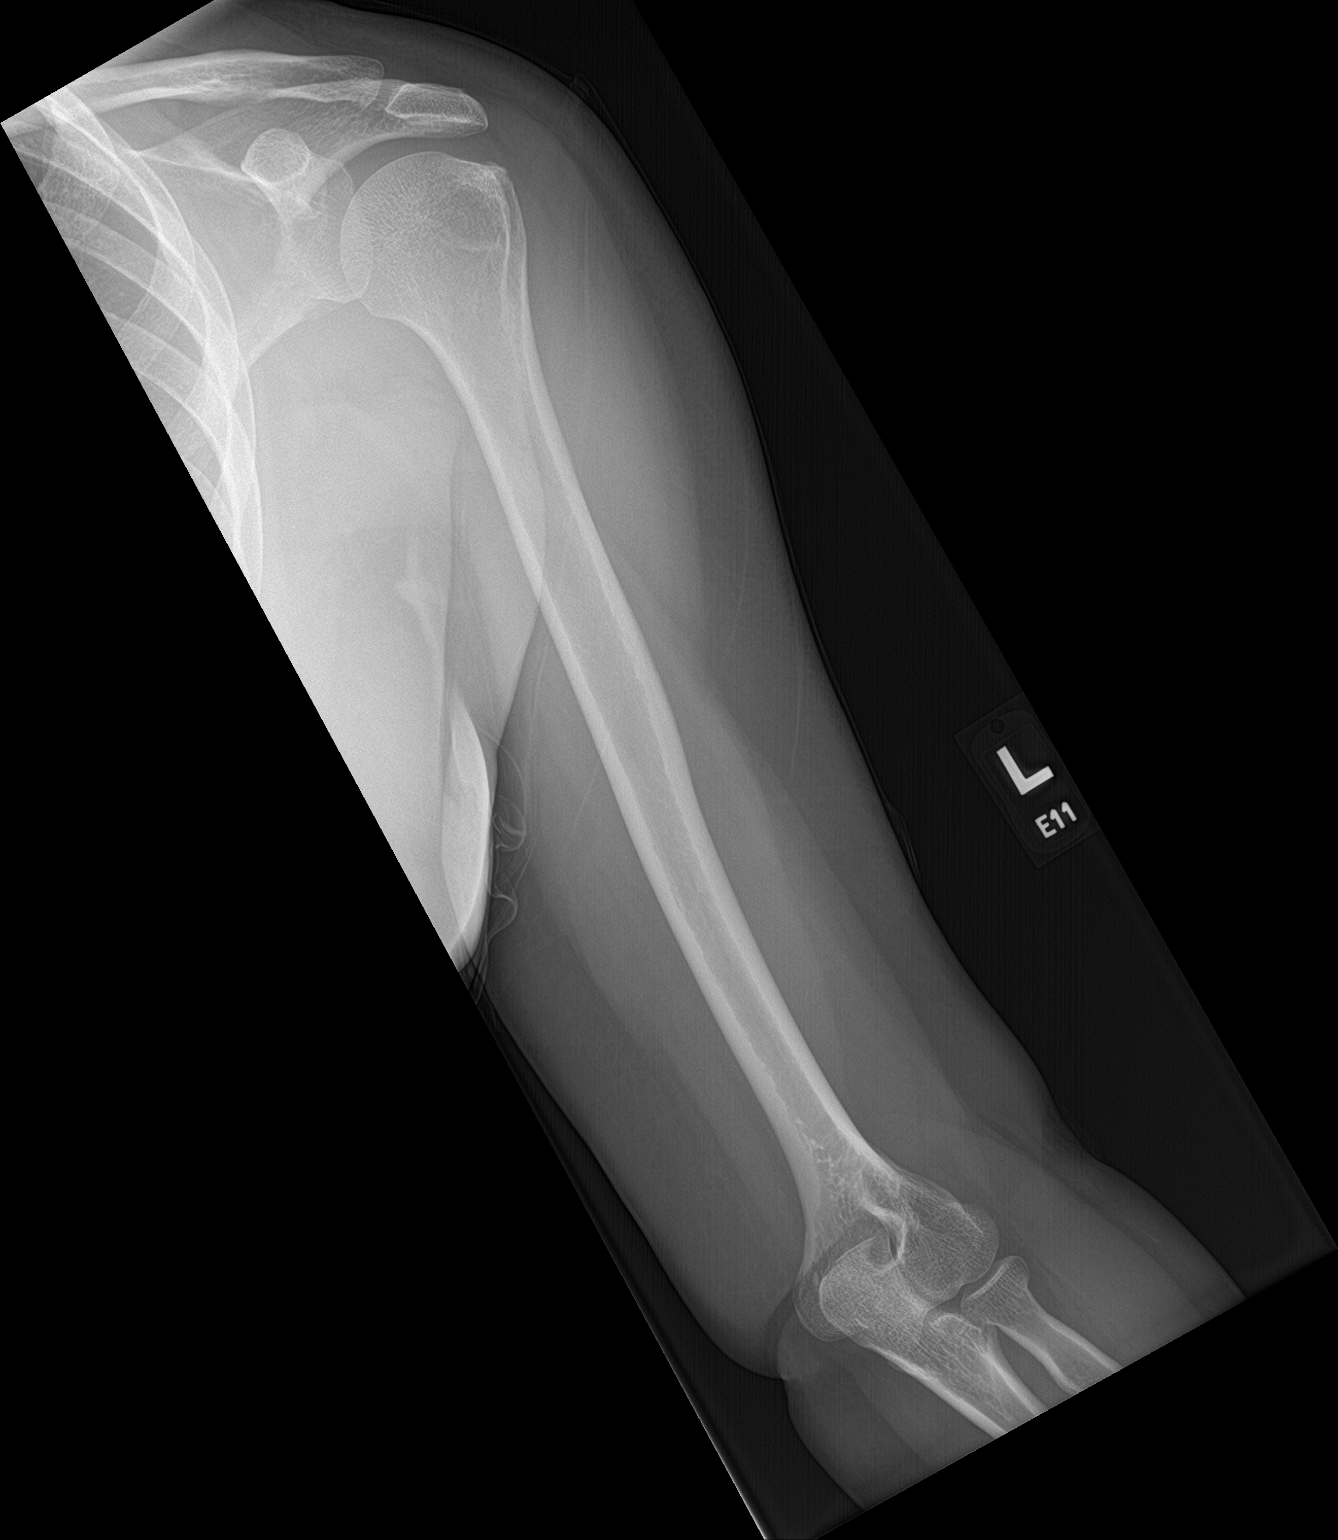

[humerus lat]
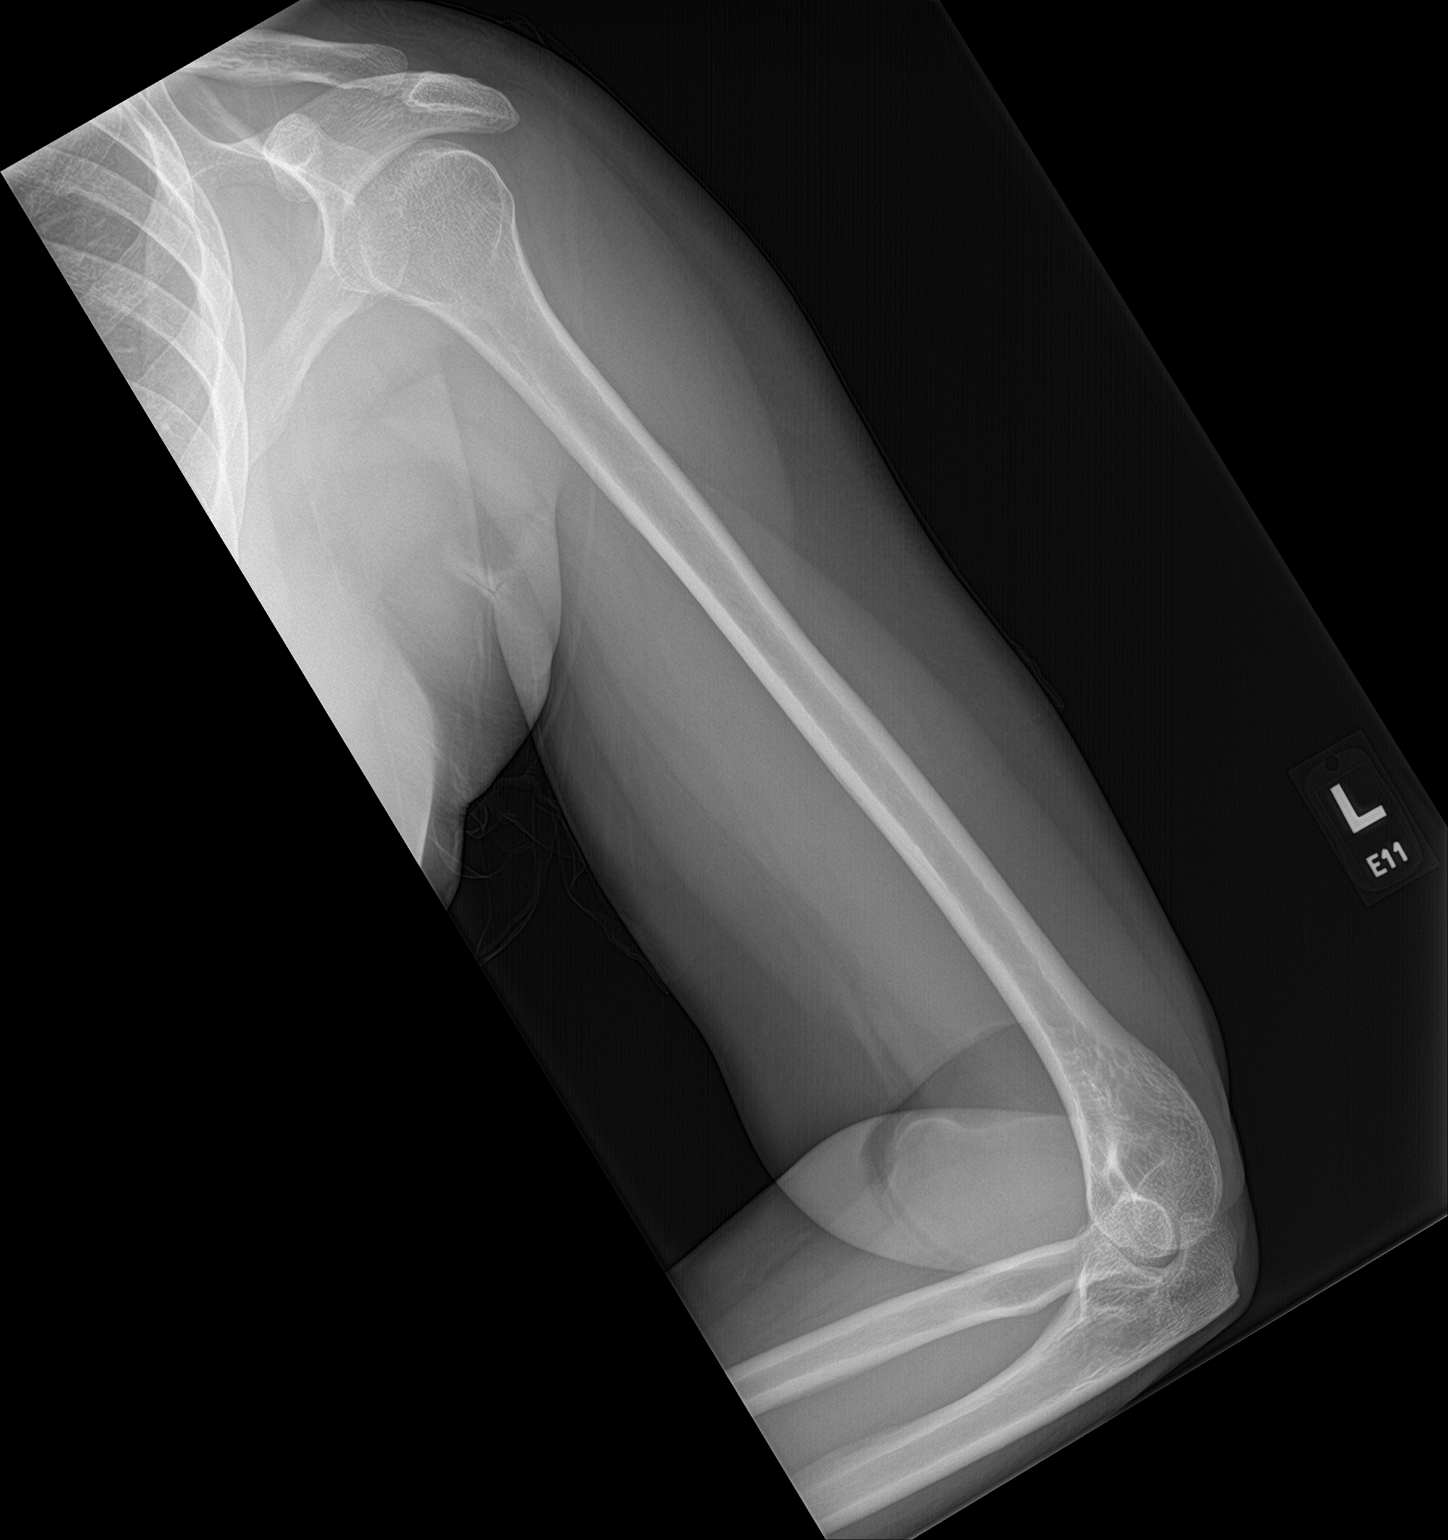

[2 of 2 positions shown; findings below may reference images not displayed]

FINDINGS: There is no evidence of fracture or other focal bone lesions. Soft
tissues are unremarkable.
IMPRESSION: Negative.

## 2023-02-25 ENCOUNTER — Telehealth: Payer: Self-pay | Admitting: Gastroenterology

## 2023-02-25 NOTE — Telephone Encounter (Signed)
PT egd/colonoscopy was rescheduled due to no insurance. It has been reinstated but will take 2-3 days for it to take effect.

## 2023-02-26 ENCOUNTER — Encounter: Payer: BC Managed Care – PPO | Admitting: Gastroenterology

## 2023-03-05 ENCOUNTER — Encounter: Payer: BC Managed Care – PPO | Admitting: Gastroenterology

## 2023-03-07 ENCOUNTER — Encounter: Payer: Self-pay | Admitting: Gastroenterology

## 2023-03-27 ENCOUNTER — Encounter: Payer: BC Managed Care – PPO | Admitting: Gastroenterology

## 2023-04-19 ENCOUNTER — Ambulatory Visit (AMBULATORY_SURGERY_CENTER): Payer: BC Managed Care – PPO | Admitting: *Deleted

## 2023-04-19 VITALS — Ht 64.0 in | Wt 179.0 lb

## 2023-04-19 DIAGNOSIS — R140 Abdominal distension (gaseous): Secondary | ICD-10-CM

## 2023-04-19 DIAGNOSIS — Z86010 Personal history of colonic polyps: Secondary | ICD-10-CM

## 2023-04-19 DIAGNOSIS — R109 Unspecified abdominal pain: Secondary | ICD-10-CM

## 2023-04-19 NOTE — Progress Notes (Signed)
No egg or soy allergy known to patient  No issues known to pt with past sedation with any surgeries or procedures Patient denies ever being told they had issues or difficulty with intubation  No FH of Malignant Hyperthermia Pt is not on diet pills Pt is not on  home 02  Pt is not on blood thinners  Pt denies issues with constipation  No A fib or A flutter Have any cardiac testing pending--NO Pt instructed to use Singlecare.com or GoodRx for a price reduction on prep   ALREADY HAS SUPREP AT HOME  INDEPENDENT WITH MOBILITY Patient's chart reviewed by [name] CNRA prior to previsit and patient appropriate for the [facility].  Previsit completed and red dot placed by patient's name on their procedure day (on provider's schedule).

## 2023-04-26 ENCOUNTER — Encounter: Payer: Self-pay | Admitting: Gastroenterology

## 2023-05-06 DIAGNOSIS — F4322 Adjustment disorder with anxiety: Secondary | ICD-10-CM | POA: Diagnosis not present

## 2023-05-06 DIAGNOSIS — Z0000 Encounter for general adult medical examination without abnormal findings: Secondary | ICD-10-CM | POA: Diagnosis not present

## 2023-05-06 DIAGNOSIS — I10 Essential (primary) hypertension: Secondary | ICD-10-CM | POA: Diagnosis not present

## 2023-05-06 DIAGNOSIS — E559 Vitamin D deficiency, unspecified: Secondary | ICD-10-CM | POA: Diagnosis not present

## 2023-05-06 DIAGNOSIS — R7301 Impaired fasting glucose: Secondary | ICD-10-CM | POA: Diagnosis not present

## 2023-05-06 DIAGNOSIS — Z13220 Encounter for screening for lipoid disorders: Secondary | ICD-10-CM | POA: Diagnosis not present

## 2023-05-09 ENCOUNTER — Encounter: Payer: Self-pay | Admitting: Gastroenterology

## 2023-05-09 ENCOUNTER — Ambulatory Visit (AMBULATORY_SURGERY_CENTER): Payer: BC Managed Care – PPO | Admitting: Gastroenterology

## 2023-05-09 VITALS — BP 130/68 | HR 71 | Temp 98.1°F | Resp 18 | Ht 63.0 in | Wt 179.0 lb

## 2023-05-09 DIAGNOSIS — R109 Unspecified abdominal pain: Secondary | ICD-10-CM

## 2023-05-09 DIAGNOSIS — Z09 Encounter for follow-up examination after completed treatment for conditions other than malignant neoplasm: Secondary | ICD-10-CM

## 2023-05-09 DIAGNOSIS — D129 Benign neoplasm of anus and anal canal: Secondary | ICD-10-CM

## 2023-05-09 DIAGNOSIS — K2970 Gastritis, unspecified, without bleeding: Secondary | ICD-10-CM

## 2023-05-09 DIAGNOSIS — R140 Abdominal distension (gaseous): Secondary | ICD-10-CM | POA: Diagnosis not present

## 2023-05-09 DIAGNOSIS — K2950 Unspecified chronic gastritis without bleeding: Secondary | ICD-10-CM | POA: Diagnosis not present

## 2023-05-09 DIAGNOSIS — D128 Benign neoplasm of rectum: Secondary | ICD-10-CM | POA: Diagnosis not present

## 2023-05-09 DIAGNOSIS — Z1211 Encounter for screening for malignant neoplasm of colon: Secondary | ICD-10-CM | POA: Diagnosis not present

## 2023-05-09 DIAGNOSIS — Z86010 Personal history of colonic polyps: Secondary | ICD-10-CM | POA: Diagnosis not present

## 2023-05-09 MED ORDER — SODIUM CHLORIDE 0.9 % IV SOLN
500.0000 mL | Freq: Once | INTRAVENOUS | Status: DC
Start: 2023-05-09 — End: 2023-05-09

## 2023-05-09 NOTE — Op Note (Signed)
[facility] Patient Name: [name] Procedure Date: 05/09/2023 10:48 AM MRN: [id number] Endoscopist: Napoleon Form , MD, [id number] Age: 57 Referring MD:  Date of Birth: October 16, 1966 Gender: Female Account #: [id number] Procedure:                Upper GI endoscopy Indications:              Epigastric abdominal pain, Dyspepsia, Follow-up of                            Helicobacter pylori Medicines:                Monitored Anesthesia Care Procedure:                Pre-Anesthesia Assessment:                           - Prior to the procedure, a History and Physical                            was performed, and patient medications and                            allergies were reviewed. The patient's tolerance of                            previous anesthesia was also reviewed. The risks                            and benefits of the procedure and the sedation                            options and risks were discussed with the patient.                            All questions were answered, and informed consent                            was obtained. Prior Anticoagulants: The patient has                            taken no anticoagulant or antiplatelet agents. ASA                            Grade Assessment: II - A patient with mild systemic                            disease. After reviewing the risks and benefits,                            the patient was deemed in satisfactory condition to                            undergo the procedure.  After obtaining informed consent, the endoscope was                            passed under direct vision. Throughout the                            procedure, the patient's blood pressure, pulse, and                            oxygen saturations were monitored continuously. The                            Olympus scope [id number] was introduced through the                            mouth, and advanced to the  second part of duodenum.                            The upper GI endoscopy was accomplished without                            difficulty. The patient tolerated the procedure                            well. Scope In: Scope Out: Findings:                 The Z-line was regular and was found 36 cm from the                            incisors.                           The exam of the esophagus was otherwise normal.                           The gastroesophageal flap valve was visualized                            endoscopically and classified as Hill Grade II                            (fold present, opens with respiration).                           Patchy mild inflammation characterized by                            congestion (edema) and erythema was found in the                            entire examined stomach. Biopsies were taken with a                            cold forceps for Helicobacter pylori testing.  The cardia and gastric fundus were normal on                            retroflexion.                           The examined duodenum was normal. Complications:            No immediate complications. Estimated Blood Loss:     Estimated blood loss was minimal. Impression:               - Z-line regular, 36 cm from the incisors.                           - Gastroesophageal flap valve classified as Hill                            Grade II (fold present, opens with respiration).                           - Gastritis. Biopsied.                           - Normal examined duodenum. Recommendation:           - Patient has a contact number available for                            emergencies. The signs and symptoms of potential                            delayed complications were discussed with the                            patient. Return to normal activities tomorrow.                            Written discharge instructions were provided to the                             patient.                           - Resume previous diet.                           - Continue present medications.                           - Await pathology results. Napoleon Form, MD 05/09/2023 11:33:10 AM This report has been signed electronically.

## 2023-05-09 NOTE — Progress Notes (Signed)
Pt resting comfortably. VSS. Airway intact. SBAR complete to RN. All questions answered.   

## 2023-05-09 NOTE — Op Note (Signed)
[facility] Patient Name: [name] Procedure Date: 05/09/2023 10:48 AM MRN: [id number] Endoscopist: Napoleon Form , MD, [id number] Age: 57 Referring MD:  Date of Birth: [birth year] Gender: Female Account #: [id number] Procedure:                Colonoscopy Indications:              High risk colon cancer surveillance: Personal                            history of colonic polyps, Last colonoscopy: 2019,                            Last colonoscopy: date unknown (unable to locate                            last colonoscopy report) Medicines:                Monitored Anesthesia Care Procedure:                Pre-Anesthesia Assessment:                           - Prior to the procedure, a History and Physical                            was performed, and patient medications and                            allergies were reviewed. The patient's tolerance of                            previous anesthesia was also reviewed. The risks                            and benefits of the procedure and the sedation                            options and risks were discussed with the patient.                            All questions were answered, and informed consent                            was obtained. Prior Anticoagulants: The patient has                            taken no anticoagulant or antiplatelet agents. ASA                            Grade Assessment: II - A patient with mild systemic                            disease. After reviewing the risks and benefits,  the patient was deemed in satisfactory condition to                            undergo the procedure.                           After obtaining informed consent, the colonoscope                            was passed under direct vision. Throughout the                            procedure, the patient's blood pressure, pulse, and                            oxygen saturations were  monitored continuously. The                            PCF-HQ190L Colonoscope [id number] was introduced                            through the anus and advanced to the the cecum,                            identified by appendiceal orifice and ileocecal                            valve. The colonoscopy was performed without                            difficulty. The patient tolerated the procedure                            well. The quality of the bowel preparation was                            good. The ileocecal valve, appendiceal orifice, and                            rectum were photographed. Scope In: 11:02:45 AM Scope Out: 11:19:48 AM Scope Withdrawal Time: 0 hours 11 minutes 41 seconds  Total Procedure Duration: 0 hours 17 minutes 3 seconds  Findings:                 The perianal and digital rectal examinations were                            normal.                           Non-bleeding internal hemorrhoids were found during                            retroflexion. The hemorrhoids were medium-sized.  A 5 mm polyp was found in the rectum. The polyp was                            sessile. The polyp was removed with a cold snare.                            Resection and retrieval were complete.                           The exam was otherwise without abnormality. Complications:            No immediate complications. Estimated Blood Loss:     Estimated blood loss was minimal. Impression:               - Non-bleeding internal hemorrhoids.                           - One 5 mm polyp in the rectum, removed with a cold                            snare. Resected and retrieved.                           - The examination was otherwise normal. Recommendation:           - Patient has a contact number available for                            emergencies. The signs and symptoms of potential                            delayed complications were discussed with the                             patient. Return to normal activities tomorrow.                            Written discharge instructions were provided to the                            patient.                           - Resume previous diet.                           - Continue present medications.                           - Await pathology results.                           - Repeat colonoscopy in 5-10 years for surveillance                            based on pathology results.                           -  Return to GI clinic PRN. Napoleon Form, MD 05/09/2023 11:30:18 AM This report has been signed electronically.

## 2023-05-09 NOTE — Progress Notes (Signed)
[facility] History and Physical   Primary Care Physician:  Thana Ates, MD   Reason for Procedure:   Epigastric abd pain, belching, and h/o colon polyps  Plan:    EGD and colonoscopy with possible interventions as needed     HPI: [name] is a very pleasant 57 y.o. female here for EGD and colonoscopy for epigastric abd pain, belching, and h/o colon polyps. She was previously followed by [facility]   The risks and benefits as well as alternatives of endoscopic procedure(s) have been discussed and reviewed. All questions answered. The patient agrees to proceed.    Past Medical History:  Diagnosis Date   Allergy    SEASONAL   Anxiety    Fibroids    patient denies fibroids   Fibroids    patient denies fibroids   Hay fever    Seasonal Allergies   Hypertension    PONV (postoperative nausea and vomiting)     Past Surgical History:  Procedure Laterality Date   BREAST BIOPSY Left 12/21/2022   MM LT BREAST BX W LOC DEV 1ST LESION IMAGE BX SPEC STEREO GUIDE 12/21/2022 [facility]   nova sure     ROBOTIC ASSISTED TOTAL HYSTERECTOMY WITH SALPINGECTOMY Bilateral 01/12/2016   Procedure: ROBOTIC ASSISTED TOTAL HYSTERECTOMY WITH BILATERAL SALPINGECTOMY;  Surgeon: Maxie Better, MD;  Location: [facility];  Service: Gynecology;  Laterality: Bilateral;  3 hrs.   TUBAL LIGATION     WISDOM TOOTH EXTRACTION      Prior to Admission medications   Medication Sig Start Date End Date Taking? Authorizing Provider  Ascorbic Acid (VITAMIN C) 100 MG tablet Take 100 mg by mouth daily.   Yes [provider]  cholecalciferol (VITAMIN D3) 25 MCG (1000 UNIT) tablet Take 1,000 Units by mouth daily.   Yes [provider]  Flaxseed, Linseed, (FLAX SEEDS PO) Take 1 tablet by mouth daily.   Yes [provider]  losartan (COZAAR) 25 MG tablet TAKE 1 TABLET(25 MG) BY MOUTH DAILY 03/05/22  Yes Saguier, Ramon Dredge, PA-C  Na Sulfate-K Sulfate-Mg Sulf  17.5-3.13-1.6 GM/177ML SOLN SMARTSIG:1 Kit(s) By Mouth Once 01/23/23  Yes [provider]  zinc gluconate 50 MG tablet Take 50 mg by mouth daily.   Yes [provider]  aspirin 81 MG chewable tablet 81 mg. Patient not taking: Reported on 05/09/2023    [provider]  BLACK CURRANT SEED OIL PO Take 1 tablet by mouth daily.    [provider]  fluticasone (FLONASE) 50 MCG/ACT nasal spray Place 2 sprays into both nostrils daily. Patient taking differently: Place 2 sprays into both nostrils as needed. 02/15/21   Saguier, Ramon Dredge, PA-C  meclizine (ANTIVERT) 12.5 MG tablet Take 1 tablet (12.5 mg total) by mouth 3 (three) times daily as needed for dizziness. 01/16/22   Saguier, Ramon Dredge, PA-C    Current Outpatient Medications  Medication Sig Dispense Refill   Ascorbic Acid (VITAMIN C) 100 MG tablet Take 100 mg by mouth daily.     cholecalciferol (VITAMIN D3) 25 MCG (1000 UNIT) tablet Take 1,000 Units by mouth daily.     Flaxseed, Linseed, (FLAX SEEDS PO) Take 1 tablet by mouth daily.     losartan (COZAAR) 25 MG tablet TAKE 1 TABLET(25 MG) BY MOUTH DAILY 30 tablet 3   Na Sulfate-K Sulfate-Mg Sulf 17.5-3.13-1.6 GM/177ML SOLN SMARTSIG:1 Kit(s) By Mouth Once     zinc gluconate 50 MG tablet Take 50 mg by mouth daily.     aspirin 81 MG  chewable tablet 81 mg. (Patient not taking: Reported on 05/09/2023)     BLACK CURRANT SEED OIL PO Take 1 tablet by mouth daily.     fluticasone (FLONASE) 50 MCG/ACT nasal spray Place 2 sprays into both nostrils daily. (Patient taking differently: Place 2 sprays into both nostrils as needed.) 16 g 1   meclizine (ANTIVERT) 12.5 MG tablet Take 1 tablet (12.5 mg total) by mouth 3 (three) times daily as needed for dizziness. 30 tablet 0   Current Facility-Administered Medications  Medication Dose Route Frequency Provider Last Rate Last Admin   0.9 %  sodium chloride infusion  500 mL Intravenous Once Napoleon Form, MD        Allergies as of  05/09/2023 - Review Complete 05/09/2023  Allergen Reaction Noted   Codeine Other (See Comments) 02/19/2013    Family History  Problem Relation Age of Onset   Hypertension Mother    Angina Mother    Fibromyalgia Mother    Asthma Mother    Depression Mother    Arthritis Mother    Diabetes Father    Depression Brother    Post-traumatic stress disorder Brother    Breast cancer Maternal Aunt        Great Aunt, Maternal   Pancreatic cancer Maternal Uncle    Allergies Daughter    Colon cancer Neg Hx    Colon polyps Neg Hx    Crohn's disease Neg Hx    Esophageal cancer Neg Hx    Rectal cancer Neg Hx    Stomach cancer Neg Hx    Ulcerative colitis Neg Hx     Social History   Socioeconomic History   Marital status: Divorced    Spouse name: Not on file   Number of children: Not on file   Years of education: Not on file   Highest education level: Not on file  Occupational History   Not on file  Tobacco Use   Smoking status: Former    Types: Cigarettes    Quit date: 1992    Years since quitting: 32.4   Smokeless tobacco: Never  Vaping Use   Vaping Use: Never used  Substance and Sexual Activity   Alcohol use: Yes    Comment: OCC   Drug use: No   Sexual activity: Yes    Birth control/protection: Surgical  Other Topics Concern   Not on file  Social History Narrative   Not on file   Social Determinants of Health   Financial Resource Strain: Not on file  Food Insecurity: Not on file  Transportation Needs: Not on file  Physical Activity: Not on file  Stress: Not on file  Social Connections: Not on file  Intimate Partner Violence: Not on file    Review of Systems:  All other review of systems negative except as mentioned in the HPI.  Physical Exam: Vital signs in last 24 hours: Blood Pressure 121/68   Pulse 76   Temperature 98.1 F (36.7 C)   Height 5\' 3"  (1.6 m)   Weight 179 lb (81.2 kg)   Last Menstrual Period 12/24/2015 (Exact Date) Comment: January 2017   Oxygen Saturation 98%   Body Mass Index 31.71 kg/m  General:   Alert, NAD Lungs:  Clear .   Heart:  Regular rate and rhythm Abdomen:  Soft, nontender and nondistended. Neuro/Psych:  Alert and cooperative. Normal mood and affect. A and O x 3  Reviewed labs, radiology imaging, old records and pertinent past GI work up  Patient is appropriate for planned procedure(s) and anesthesia in an ambulatory setting   K. Scherry Ran , MD [id number]

## 2023-05-09 NOTE — Patient Instructions (Signed)
YOU HAD AN ENDOSCOPIC PROCEDURE TODAY AT THE [facility]:   Refer to the procedure report that was given to you for any specific questions about what was found during the examination.  If the procedure report does not answer your questions, please call your gastroenterologist to clarify.  If you requested that your care partner not be given the details of your procedure findings, then the procedure report has been included in a sealed envelope for you to review at your convenience later.  **Handouts given on polyps and hemorrhoids**  YOU SHOULD EXPECT: Some feelings of bloating in the abdomen. Passage of more gas than usual.  Walking can help get rid of the air that was put into your GI tract during the procedure and reduce the bloating. If you had a lower endoscopy (such as a colonoscopy or flexible sigmoidoscopy) you may notice spotting of blood in your stool or on the toilet paper. If you underwent a bowel prep for your procedure, you may not have a normal bowel movement for a few days.  Please Note:  You might notice some irritation and congestion in your nose or some drainage.  This is from the oxygen used during your procedure.  There is no need for concern and it should clear up in a day or so.  SYMPTOMS TO REPORT IMMEDIATELY:  Following lower endoscopy (colonoscopy or flexible sigmoidoscopy):  Excessive amounts of blood in the stool  Significant tenderness or worsening of abdominal pains  Swelling of the abdomen that is new, acute  Fever of 100F or higher  Following upper endoscopy (EGD)  Vomiting of blood or coffee ground material  New chest pain or pain under the shoulder blades  Painful or persistently difficult swallowing  New shortness of breath  Fever of 100F or higher  Black, tarry-looking stools  For urgent or emergent issues, a gastroenterologist can be reached at any hour by calling [phone]. Do not use MyChart messaging for urgent concerns.    DIET:   We do recommend a small meal at first, but then you may proceed to your regular diet.  Drink plenty of fluids but you should avoid alcoholic beverages for 24 hours.  ACTIVITY:  You should plan to take it easy for the rest of today and you should NOT DRIVE or use heavy machinery until tomorrow (because of the sedation medicines used during the test).    FOLLOW UP: Our staff will call the number listed on your records the next business day following your procedure.  We will call around 7:15- 8:00 am to check on you and address any questions or concerns that you may have regarding the information given to you following your procedure. If we do not reach you, we will leave a message.     If any biopsies were taken you will be contacted by phone or by letter within the next 1-3 weeks.  Please call us at [phone] if you have not heard about the biopsies in 3 weeks.    SIGNATURES/CONFIDENTIALITY: You and/or your care partner have signed paperwork which will be entered into your electronic medical record.  These signatures attest to the fact that that the information above on your After Visit Summary has been reviewed and is understood.  Full responsibility of the confidentiality of this discharge information lies with you and/or your care-partner.

## 2023-05-09 NOTE — Progress Notes (Signed)
Called to room to assist during endoscopic procedure.  Patient ID and intended procedure confirmed with present staff. Received instructions for my participation in the procedure from the performing physician.  

## 2023-05-09 NOTE — Progress Notes (Signed)
Pt's states no medical or surgical changes since previsit or office visit. 

## 2023-05-10 ENCOUNTER — Telehealth: Payer: Self-pay

## 2023-05-10 NOTE — Telephone Encounter (Signed)
  Follow up Call-     05/09/2023    9:55 AM 04/24/2021    9:21 AM  Call back number  Post procedure Call Back phone  # [phone]  Permission to leave phone message Yes No     Patient questions:  Do you have a fever, pain , or abdominal swelling? No. Pain Score  0 *  Have you tolerated food without any problems? Yes.    Have you been able to return to your normal activities? Yes.    Do you have any questions about your discharge instructions: Diet   No. Medications  No. Follow up visit  No.  Do you have questions or concerns about your Care? No.  Actions: * If pain score is 4 or above: No action needed, pain <4.

## 2023-05-30 ENCOUNTER — Encounter: Payer: Self-pay | Admitting: Gastroenterology

## 2023-05-30 DIAGNOSIS — Z01419 Encounter for gynecological examination (general) (routine) without abnormal findings: Secondary | ICD-10-CM | POA: Diagnosis not present

## 2023-05-30 DIAGNOSIS — Z113 Encounter for screening for infections with a predominantly sexual mode of transmission: Secondary | ICD-10-CM | POA: Diagnosis not present

## 2023-05-30 DIAGNOSIS — N898 Other specified noninflammatory disorders of vagina: Secondary | ICD-10-CM | POA: Diagnosis not present

## 2023-05-30 DIAGNOSIS — R21 Rash and other nonspecific skin eruption: Secondary | ICD-10-CM | POA: Diagnosis not present

## 2023-06-18 ENCOUNTER — Telehealth: Payer: Self-pay | Admitting: Gastroenterology

## 2023-06-18 ENCOUNTER — Other Ambulatory Visit: Payer: Self-pay

## 2023-06-18 DIAGNOSIS — R109 Unspecified abdominal pain: Secondary | ICD-10-CM

## 2023-06-18 DIAGNOSIS — R1030 Lower abdominal pain, unspecified: Secondary | ICD-10-CM

## 2023-06-18 MED ORDER — DICYCLOMINE HCL 10 MG PO CAPS: 10.0000 mg | ORAL_CAPSULE | Freq: Three times a day (TID) | ORAL | 0 refills | Status: AC | PRN

## 2023-06-18 NOTE — Telephone Encounter (Signed)
DOD  Former patient of Dr Orvan Falconer. Seen last in May by Dr Lavon Paganini for EGD and colonoscopy. She calls with complaints of lower abdominal discomfort "past few days that seems to be getting worse." Described as a cramping type pain. Denies constipation stating she has "daily bowel movements" that pass without difficulty. She has a little nausea that comes and goes. She has not taken any OTC for her symptoms. She takes PRN over the counter omeprazole for occasional indigestion. She last took this 2 days ago.  Please advise.

## 2023-06-18 NOTE — Telephone Encounter (Signed)
Patient advised of the plan. Patient agrees to this plan of care.

## 2023-06-18 NOTE — Telephone Encounter (Signed)
Inbound call from patient requesting a call back to discuss issues with abdominal pain. Please advise.

## 2023-06-19 ENCOUNTER — Ambulatory Visit (HOSPITAL_COMMUNITY)
Admission: RE | Admit: 2023-06-19 | Discharge: 2023-06-19 | Disposition: A | Discharge status: Home / Self Care | Payer: BC Managed Care – PPO | Source: Ambulatory Visit | Attending: Internal Medicine | Admitting: Internal Medicine

## 2023-06-19 DIAGNOSIS — R1030 Lower abdominal pain, unspecified: Secondary | ICD-10-CM | POA: Insufficient documentation

## 2023-06-19 DIAGNOSIS — R109 Unspecified abdominal pain: Secondary | ICD-10-CM | POA: Diagnosis not present

## 2023-06-19 DIAGNOSIS — R1084 Generalized abdominal pain: Secondary | ICD-10-CM | POA: Diagnosis not present

## 2023-06-19 LAB — POCT I-STAT CREATININE: Creatinine, Ser: 0.8 mg/dL (ref 0.44–1.00)

## 2023-06-19 MED ORDER — IOHEXOL 300 MG/ML  SOLN
100.0000 mL | Freq: Once | INTRAMUSCULAR | Status: AC | PRN
  Administered 2023-06-19: 100 mL via INTRAVENOUS

## 2023-10-31 DIAGNOSIS — R42 Dizziness and giddiness: Secondary | ICD-10-CM | POA: Diagnosis not present

## 2023-10-31 DIAGNOSIS — H6121 Impacted cerumen, right ear: Secondary | ICD-10-CM | POA: Diagnosis not present

## 2023-11-26 DIAGNOSIS — R42 Dizziness and giddiness: Secondary | ICD-10-CM | POA: Diagnosis not present

## 2024-01-08 ENCOUNTER — Other Ambulatory Visit: Payer: Self-pay | Admitting: Internal Medicine

## 2024-01-08 DIAGNOSIS — Z1231 Encounter for screening mammogram for malignant neoplasm of breast: Secondary | ICD-10-CM

## 2024-01-22 ENCOUNTER — Ambulatory Visit
Admission: RE | Admit: 2024-01-22 | Discharge: 2024-01-22 | Disposition: A | Discharge status: Home / Self Care | Payer: BLUE CROSS/BLUE SHIELD | Source: Ambulatory Visit | Attending: Internal Medicine | Admitting: Internal Medicine

## 2024-01-22 DIAGNOSIS — Z1231 Encounter for screening mammogram for malignant neoplasm of breast: Secondary | ICD-10-CM

## 2024-01-29 ENCOUNTER — Other Ambulatory Visit (HOSPITAL_BASED_OUTPATIENT_CLINIC_OR_DEPARTMENT_OTHER): Payer: Self-pay | Admitting: Internal Medicine

## 2024-01-29 DIAGNOSIS — E782 Mixed hyperlipidemia: Secondary | ICD-10-CM

## 2024-01-29 DIAGNOSIS — E7841 Elevated Lipoprotein(a): Secondary | ICD-10-CM

## 2024-02-04 ENCOUNTER — Ambulatory Visit (HOSPITAL_COMMUNITY)
Admission: RE | Admit: 2024-02-04 | Discharge: 2024-02-04 | Disposition: A | Discharge status: Home / Self Care | Payer: Self-pay | Source: Ambulatory Visit | Attending: Internal Medicine | Admitting: Internal Medicine

## 2024-02-04 DIAGNOSIS — E782 Mixed hyperlipidemia: Secondary | ICD-10-CM | POA: Insufficient documentation

## 2024-02-04 DIAGNOSIS — E7841 Elevated Lipoprotein(a): Secondary | ICD-10-CM | POA: Insufficient documentation

## 2024-11-25 ENCOUNTER — Other Ambulatory Visit: Payer: Self-pay | Admitting: Medical Genetics

## 2025-01-26 ENCOUNTER — Other Ambulatory Visit
